# Patient Record
Sex: Female | Born: 1990 | Race: Black or African American | Hispanic: No | Marital: Single | Smoking: Former smoker
Health system: Southern US, Community
[De-identification: ages and names within clinical notes are randomized; demographics above are authoritative.]

## PROBLEM LIST (undated history)

## (undated) ENCOUNTER — Inpatient Hospital Stay (HOSPITAL_COMMUNITY): Payer: Self-pay

## (undated) DIAGNOSIS — N83209 Unspecified ovarian cyst, unspecified side: Secondary | ICD-10-CM

## (undated) HISTORY — PX: OVARIAN CYST REMOVAL: SHX89

## (undated) HISTORY — PX: INDUCED ABORTION: SHX677

---

## 2014-03-15 ENCOUNTER — Inpatient Hospital Stay (HOSPITAL_COMMUNITY)
Admission: AD | Admit: 2014-03-15 | Discharge: 2014-03-16 | Disposition: A | Discharge status: Home / Self Care | Payer: Medicaid - Out of State | Source: Ambulatory Visit | Attending: Obstetrics & Gynecology | Admitting: Obstetrics & Gynecology

## 2014-03-15 ENCOUNTER — Encounter (HOSPITAL_COMMUNITY): Payer: Self-pay | Admitting: *Deleted

## 2014-03-15 DIAGNOSIS — A499 Bacterial infection, unspecified: Secondary | ICD-10-CM | POA: Insufficient documentation

## 2014-03-15 DIAGNOSIS — O208 Other hemorrhage in early pregnancy: Secondary | ICD-10-CM | POA: Insufficient documentation

## 2014-03-15 DIAGNOSIS — Z87891 Personal history of nicotine dependence: Secondary | ICD-10-CM | POA: Insufficient documentation

## 2014-03-15 DIAGNOSIS — R109 Unspecified abdominal pain: Secondary | ICD-10-CM | POA: Insufficient documentation

## 2014-03-15 DIAGNOSIS — O219 Vomiting of pregnancy, unspecified: Secondary | ICD-10-CM

## 2014-03-15 DIAGNOSIS — N760 Acute vaginitis: Secondary | ICD-10-CM

## 2014-03-15 DIAGNOSIS — O210 Mild hyperemesis gravidarum: Secondary | ICD-10-CM | POA: Insufficient documentation

## 2014-03-15 DIAGNOSIS — O2390 Unspecified genitourinary tract infection in pregnancy, unspecified trimester: Secondary | ICD-10-CM | POA: Insufficient documentation

## 2014-03-15 DIAGNOSIS — B9689 Other specified bacterial agents as the cause of diseases classified elsewhere: Secondary | ICD-10-CM | POA: Insufficient documentation

## 2014-03-15 LAB — URINALYSIS, ROUTINE W REFLEX MICROSCOPIC
BILIRUBIN URINE: NEGATIVE
GLUCOSE, UA: NEGATIVE mg/dL
HGB URINE DIPSTICK: NEGATIVE
Ketones, ur: >80 mg/dL — AB
Leukocytes, UA: NEGATIVE
Nitrite: NEGATIVE
Protein, ur: NEGATIVE mg/dL
Specific Gravity, Urine: 1.015 (ref 1.005–1.030)
UROBILINOGEN UA: 0.2 mg/dL (ref 0.0–1.0)
pH: 6.5 (ref 5.0–8.0)

## 2014-03-15 LAB — POCT PREGNANCY, URINE: PREG TEST UR: POSITIVE — AB

## 2014-03-15 MED ORDER — PROMETHAZINE HCL 25 MG/ML IJ SOLN
25.0000 mg | Freq: Once | INTRAVENOUS | Status: AC
  Administered 2014-03-15: 25 mg via INTRAVENOUS
  Filled 2014-03-15: qty 1

## 2014-03-15 MED ORDER — FAMOTIDINE IN NACL 20-0.9 MG/50ML-% IV SOLN
20.0000 mg | Freq: Once | INTRAVENOUS | Status: AC
  Administered 2014-03-16: 20 mg via INTRAVENOUS
  Filled 2014-03-15: qty 50

## 2014-03-15 NOTE — MAU Note (Addendum)
PT  SAYS SHE  HAS BEEN VOMITING   SINCE 0700-    AND EVERYDAY  X2 WEEKS.   SHE DID   HPT-  POSITIVE.  LAST SEX-  6-7.   WAS ON JENEL FE- 120-  STOPPED   ON 5-10.    GOT [facility]- IN [place].  MOVED  HERE 5-31    SAYS HER THROAT BURNS  AFTER  SHE  VOMITED  SOME ALMONDS

## 2014-03-15 NOTE — MAU Provider Note (Signed)
History     CSN: [id number]  Arrival date and time: 03/15/14 2231   First Mariah Raymond Initiated Contact with Patient 03/15/14 2338      No chief complaint on file.  HPI [name] is a 23 y.o. G3P0020 at 1532w6d who presents to MAU today with complaint of N/V. The patient states this has been going on x 2 weeks, but much worse today. She states no PO intake since yesterday. She denies fever or diarrhea or constipation. She has had some lower abdominal pain, but denies vaginal bleeding or discharge.   OB History   Grav Para Term Preterm Abortions TAB SAB Ect Mult Living   3    2 1 1          Past Medical History  Diagnosis Date  . Medical history non-contributory     Past Surgical History  Procedure Laterality Date  . Ovarian cyst removal      removal of right ovary    History reviewed. No pertinent family history.  History  Substance Use Topics  . Smoking status: Former Games developermoker  . Smokeless tobacco: Not on file  . Alcohol Use: Yes     Comment: "quit"- last use May 2015    Allergies: Not on File  No prescriptions prior to admission    Review of Systems  Constitutional: Negative for fever and malaise/fatigue.  Gastrointestinal: Positive for nausea, vomiting and abdominal pain. Negative for diarrhea and constipation.  Genitourinary: Negative for dysuria, urgency and frequency.       Neg -vaginal bleeding, discharge   Physical Exam   Blood pressure 101/41, pulse 77, temperature 98.2 F (36.8 C), temperature source Oral, resp. rate 18, height 5\' 3"  (1.6 m), weight 110 lb 6 oz (50.066 kg), last menstrual period 01/22/2014, unknown if currently breastfeeding.  Physical Exam  Constitutional: She is oriented to person, place, and time. She appears well-developed and well-nourished. No distress.  HENT:  Head: Normocephalic and atraumatic.  Cardiovascular: Normal rate, regular rhythm and normal heart sounds.   Respiratory: Effort normal and breath sounds normal. No  respiratory distress.  GI: Soft. Bowel sounds are normal. She exhibits no distension and no mass. There is tenderness (moderate tenderness to palpation of the lower abdomen bilaterally). There is no rebound and no guarding.  Genitourinary: Uterus is enlarged (slightly). Uterus is not tender. Cervix exhibits no motion tenderness, no discharge and no friability. Right adnexum displays tenderness. Right adnexum displays no mass. Left adnexum displays no mass and no tenderness. No bleeding around the vagina. No vaginal discharge found.  Neurological: She is alert and oriented to person, place, and time.  Skin: Skin is warm and dry. No erythema.  Psychiatric: She has a normal mood and affect.   Results for orders placed during the hospital encounter of 03/15/14 (from the past 24 hour(s))  URINALYSIS, ROUTINE W REFLEX MICROSCOPIC     Status: Abnormal   Collection Time    03/15/14 10:58 PM      Result Value Ref Range   Color, Urine YELLOW  YELLOW   APPearance CLEAR  CLEAR   Specific Gravity, Urine 1.015  1.005 - 1.030   pH 6.5  5.0 - 8.0   Glucose, UA NEGATIVE  NEGATIVE mg/dL   Hgb urine dipstick NEGATIVE  NEGATIVE   Bilirubin Urine NEGATIVE  NEGATIVE   Ketones, ur >80 (*) NEGATIVE mg/dL   Protein, ur NEGATIVE  NEGATIVE mg/dL   Urobilinogen, UA 0.2  0.0 - 1.0 mg/dL   Nitrite NEGATIVE  NEGATIVE   Leukocytes, UA NEGATIVE  NEGATIVE  POCT PREGNANCY, URINE     Status: Abnormal   Collection Time    03/15/14 11:06 PM      Result Value Ref Range   Preg Test, Ur POSITIVE (*) NEGATIVE  CBC WITH DIFFERENTIAL     Status: Abnormal   Collection Time    03/15/14 11:55 PM      Result Value Ref Range   WBC 6.6  4.0 - 10.5 K/uL   RBC 4.31  3.87 - 5.11 MIL/uL   Hemoglobin 12.2  12.0 - 15.0 g/dL   HCT 16.1 (*) 09.6 - 04.5 %   MCV 83.1  78.0 - 100.0 fL   MCH 28.3  26.0 - 34.0 pg   MCHC 34.1  30.0 - 36.0 g/dL   RDW 40.9  81.1 - 91.4 %   Platelets 264  150 - 400 K/uL   Neutrophils Relative % 72  43 - 77  %   Neutro Abs 4.8  1.7 - 7.7 K/uL   Lymphocytes Relative 23  12 - 46 %   Lymphs Abs 1.5  0.7 - 4.0 K/uL   Monocytes Relative 4  3 - 12 %   Monocytes Absolute 0.3  0.1 - 1.0 K/uL   Eosinophils Relative 1  0 - 5 %   Eosinophils Absolute 0.0  0.0 - 0.7 K/uL   Basophils Relative 0  0 - 1 %   Basophils Absolute 0.0  0.0 - 0.1 K/uL  ABO/RH     Status: None   Collection Time    03/15/14 11:55 PM      Result Value Ref Range   ABO/RH(D) B POS    HCG, QUANTITATIVE, PREGNANCY     Status: Abnormal   Collection Time    03/15/14 11:55 PM      Result Value Ref Range   hCG, Beta Chain, Quant, [place] [address] (*) <5 mIU/mL  WET PREP, GENITAL     Status: Abnormal   Collection Time    03/15/14 11:58 PM      Result Value Ref Range   Yeast Wet Prep HPF POC NONE SEEN  NONE SEEN   Trich, Wet Prep NONE SEEN  NONE SEEN   Clue Cells Wet Prep HPF POC MODERATE (*) NONE SEEN   WBC, Wet Prep HPF POC FEW (*) NONE SEEN   US Ob Comp Less 14 Wks  03/16/2014   CLINICAL DATA:  Low abdominal pain. Positive urine pregnancy test. Estimated gestational age by LMP is 6 weeks 6 days. Quantitative beta HCG is 47,894.  EXAM: OBSTETRIC <14 WK ULTRASOUND  TECHNIQUE: Transabdominal ultrasound was performed for evaluation of the gestation as well as the maternal uterus and adnexal regions.  COMPARISON:  None.  FINDINGS: Intrauterine gestational sac: A single intrauterine gestational sac is visualized.  Yolk sac:  Yolk sac is visualized.  Embryo:  Fetal pole is visualized.  Cardiac Activity: Fetal cardiac activity is observed.  Heart Rate: 134 bpm  CRL:   6.5  mm   6 w 4 d                  [place] EDC: 11/05/2014  Maternal uterus/adnexae: There is a small subchorionic hemorrhage present. No myometrial mass lesions. Right ovary is identified and appears normal. Left ovary is not visualized. Mild free fluid in the pelvis.  IMPRESSION: Single intrauterine pregnancy identified. Estimated gestational age by crown-rump length is 6 weeks 4 days. This is  consistent with reported  maternal dates. Small subchorionic hemorrhage is identified.   Electronically Signed   By: Burman NievesWilliam  Stevens M.D.   On: 03/16/2014 01:25   [place] Ob Transvaginal  03/16/2014   CLINICAL DATA:  Low abdominal pain. Positive urine pregnancy test. Estimated gestational age by LMP is 6 weeks 6 days. Quantitative beta HCG is 47,894.  EXAM: OBSTETRIC <14 WK ULTRASOUND  TECHNIQUE: Transabdominal ultrasound was performed for evaluation of the gestation as well as the maternal uterus and adnexal regions.  COMPARISON:  None.  FINDINGS: Intrauterine gestational sac: A single intrauterine gestational sac is visualized.  Yolk sac:  Yolk sac is visualized.  Embryo:  Fetal pole is visualized.  Cardiac Activity: Fetal cardiac activity is observed.  Heart Rate: 134 bpm  CRL:   6.5  mm   6 w 4 d                  US EDC: 11/05/2014  Maternal uterus/adnexae: There is a small subchorionic hemorrhage present. No myometrial mass lesions. Right ovary is identified and appears normal. Left ovary is not visualized. Mild free fluid in the pelvis.  IMPRESSION: Single intrauterine pregnancy identified. Estimated gestational age by crown-rump length is 6 weeks 4 days. This is consistent with reported maternal dates. Small subchorionic hemorrhage is identified.   Electronically Signed   By: Burman NievesWilliam  Stevens M.D.   On: 03/16/2014 01:25    MAU Course  Procedures None  MDM +UPT UA, wet prep, GC/Chlamydia, CBC, ABO/Rh, quant hCG and US today 1 liter IV D5LR with 25 mg Phenergan infusion and 20 mg Pepcid IVPB given Patient is still reporting nausea 10 mg Reglan IV given. Patient reports resolution of symptoms. No additional episodes of emesis in MAU.  Assessment and Plan  A: SIUP at 7453w4d with normal cardiac activity Small subchorionic hemorrhage Bacterial vaginosis Nausea and vomiting in pregnancy prior to [redacted] weeks gestation  P: Discharge home Rx for Reglan, Phenergan suppositories and Flagyl given to  patient Bleeding precautions discussed Patient advised to increased PO hydration and advance diet as tolerated Patient advised to start prenatal care ASAP Patient may return to MAU as needed or if her condition were to change or worsen   Freddi StarrJulie N Ethier, PA-C  03/16/2014, 2:47 AM

## 2014-03-16 ENCOUNTER — Inpatient Hospital Stay (HOSPITAL_COMMUNITY): Payer: Medicaid - Out of State

## 2014-03-16 ENCOUNTER — Encounter (HOSPITAL_COMMUNITY): Payer: Self-pay | Admitting: Medical

## 2014-03-16 DIAGNOSIS — O210 Mild hyperemesis gravidarum: Secondary | ICD-10-CM

## 2014-03-16 LAB — CBC WITH DIFFERENTIAL/PLATELET
Basophils Absolute: 0 10*3/uL (ref 0.0–0.1)
Basophils Relative: 0 % (ref 0–1)
Eosinophils Absolute: 0 10*3/uL (ref 0.0–0.7)
Eosinophils Relative: 1 % (ref 0–5)
HEMATOCRIT: 35.8 % — AB (ref 36.0–46.0)
HEMOGLOBIN: 12.2 g/dL (ref 12.0–15.0)
LYMPHS PCT: 23 % (ref 12–46)
Lymphs Abs: 1.5 10*3/uL (ref 0.7–4.0)
MCH: 28.3 pg (ref 26.0–34.0)
MCHC: 34.1 g/dL (ref 30.0–36.0)
MCV: 83.1 fL (ref 78.0–100.0)
MONO ABS: 0.3 10*3/uL (ref 0.1–1.0)
MONOS PCT: 4 % (ref 3–12)
Neutro Abs: 4.8 10*3/uL (ref 1.7–7.7)
Neutrophils Relative %: 72 % (ref 43–77)
Platelets: 264 10*3/uL (ref 150–400)
RBC: 4.31 MIL/uL (ref 3.87–5.11)
RDW: 12.5 % (ref 11.5–15.5)
WBC: 6.6 10*3/uL (ref 4.0–10.5)

## 2014-03-16 LAB — GC/CHLAMYDIA PROBE AMP
CT PROBE, AMP APTIMA: NEGATIVE
GC Probe RNA: NEGATIVE

## 2014-03-16 LAB — WET PREP, GENITAL
TRICH WET PREP: NONE SEEN
Yeast Wet Prep HPF POC: NONE SEEN

## 2014-03-16 LAB — HCG, QUANTITATIVE, PREGNANCY: hCG, Beta Chain, Quant, S: 47894 m[IU]/mL — ABNORMAL HIGH (ref <5)

## 2014-03-16 LAB — ABO/RH: ABO/RH(D): B POS

## 2014-03-16 LAB — HIV ANTIBODY (ROUTINE TESTING W REFLEX): HIV 1&2 Ab, 4th Generation: NONREACTIVE

## 2014-03-16 MED ORDER — METOCLOPRAMIDE HCL 10 MG PO TABS: 10.0000 mg | ORAL_TABLET | Freq: Four times a day (QID) | ORAL | Status: DC

## 2014-03-16 MED ORDER — METOCLOPRAMIDE HCL 5 MG/ML IJ SOLN
10.0000 mg | Freq: Once | INTRAMUSCULAR | Status: AC
  Administered 2014-03-16: 10 mg via INTRAVENOUS
  Filled 2014-03-16: qty 2

## 2014-03-16 MED ORDER — METRONIDAZOLE 500 MG PO TABS: 500.0000 mg | ORAL_TABLET | Freq: Two times a day (BID) | ORAL | Status: DC

## 2014-03-16 MED ORDER — LACTATED RINGERS IV BOLUS (SEPSIS)
1000.0000 mL | Freq: Once | INTRAVENOUS | Status: AC
  Administered 2014-03-16: 1000 mL via INTRAVENOUS

## 2014-03-16 MED ORDER — PROMETHAZINE HCL 25 MG RE SUPP: 25.0000 mg | Freq: Four times a day (QID) | RECTAL | Status: DC | PRN

## 2014-03-16 NOTE — Discharge Instructions (Signed)
Bacterial Vaginosis Bacterial vaginosis is a vaginal infection that occurs when the normal balance of bacteria in the vagina is disrupted. It results from an overgrowth of certain bacteria. This is the most common vaginal infection in women of childbearing age. Treatment is important to prevent complications, especially in pregnant women, as it can cause a premature delivery. CAUSES  Bacterial vaginosis is caused by an increase in harmful bacteria that are normally present in smaller amounts in the vagina. Several different kinds of bacteria can cause bacterial vaginosis. However, the reason that the condition develops is not fully understood. RISK FACTORS Certain activities or behaviors can put you at an increased risk of developing bacterial vaginosis, including:  Having a new sex partner or multiple sex partners.  Douching.  Using an intrauterine device (IUD) for contraception. Women do not get bacterial vaginosis from toilet seats, bedding, swimming pools, or contact with objects around them. SIGNS AND SYMPTOMS  Some women with bacterial vaginosis have no signs or symptoms. Common symptoms include:  Grey vaginal discharge.  A fishlike odor with discharge, especially after sexual intercourse.  Itching or burning of the vagina and vulva.  Burning or pain with urination. DIAGNOSIS  Your health care provider will take a medical history and examine the vagina for signs of bacterial vaginosis. A sample of vaginal fluid may be taken. Your health care provider will look at this sample under a microscope to check for bacteria and abnormal cells. A vaginal pH test may also be done.  TREATMENT  Bacterial vaginosis may be treated with antibiotic medicines. These may be given in the form of a pill or a vaginal cream. A second round of antibiotics may be prescribed if the condition comes back after treatment.  HOME CARE INSTRUCTIONS   Only take over-the-counter or prescription medicines as  directed by your health care provider.  If antibiotic medicine was prescribed, take it as directed. Make sure you finish it even if you start to feel better.  Do not have sex until treatment is completed.  Tell all sexual partners that you have a vaginal infection. They should see their health care provider and be treated if they have problems, such as a mild rash or itching.  Practice safe sex by using condoms and only having one sex partner. SEEK MEDICAL CARE IF:   Your symptoms are not improving after 3 days of treatment.  You have increased discharge or pain.  You have a fever. MAKE SURE YOU:   Understand these instructions.  Will watch your condition.  Will get help right away if you are not doing well or get worse. FOR MORE INFORMATION  Centers for Disease Control and Prevention, Division of STD Prevention: SolutionApps.co.zawww.cdc.gov/std American Sexual Health Association (ASHA): www.ashastd.org  Document Released: 09/10/2005 Document Revised: 07/01/2013 Document Reviewed: 04/22/2013 [facility]ExitCare Patient Information 2015 [place], [place]. This information is not intended to replace advice given to you by your health care provider. Make sure you discuss any questions you have with your health care provider. Morning Sickness Morning sickness is when you feel sick to your stomach (nauseous) during pregnancy. You may feel sick to your stomach and throw up (vomit). You may feel sick in the morning, but you can feel this way any time of day. Some women feel very sick to their stomach and cannot stop throwing up (hyperemesis gravidarum). HOME CARE  Only take medicines as told by your doctor.  Take multivitamins as told by your doctor. Taking multivitamins before getting pregnant can stop or  lessen the harshness of morning sickness. °· Eat dry toast or unsalted crackers before getting out of bed. °· Eat 5 to 6 small meals a day. °· Eat dry and bland foods like rice and baked potatoes. °· Do not drink  liquids with meals. Drink between meals. °· Do not eat greasy, fatty, or spicy foods. °· Have someone cook for you if the smell of food causes you to feel sick or throw up. °· If you feel sick to your stomach after taking prenatal vitamins, take them at night or with a snack. °· Eat protein when you need a snack (nuts, yogurt, cheese). °· Eat unsweetened gelatins for dessert. °· Wear a bracelet used for sea sickness (acupressure wristband). °· Go to a doctor that puts thin needles into certain body points (acupuncture) to improve how you feel. °· Do not smoke. °· Use a humidifier to keep the air in your house free of odors. °· Get lots of fresh air. °GET HELP IF: °· You need medicine to feel better. °· You feel dizzy or lightheaded. °· You are losing weight. °GET HELP RIGHT AWAY IF:  °· You feel very sick to your stomach and cannot stop throwing up. °· You pass out (faint). °MAKE SURE YOU: °· Understand these instructions. °· Will watch your condition. °· Will get help right away if you are not doing well or get worse. °Document Released: 10/18/2004 Document Revised: 09/15/2013 Document Reviewed: 02/25/2013 °ExitCare® Patient Information ©2015 ExitCare, LLC. This information is not intended to replace advice given to you by your health care provider. Make sure you discuss any questions you have with your health care provider. ° °

## 2014-03-23 NOTE — MAU Provider Note (Signed)
Attestation of Attending Supervision of Advanced Practitioner (CNM/NP): Evaluation and management procedures were performed by the Advanced Practitioner under my supervision and collaboration. I have reviewed the Advanced Practitioner's note and chart, and I agree with the management and plan.  LEGGETT,KELLY H. 6:44 AM

## 2014-04-13 ENCOUNTER — Encounter (HOSPITAL_COMMUNITY): Payer: Self-pay | Admitting: *Deleted

## 2014-04-13 ENCOUNTER — Inpatient Hospital Stay (HOSPITAL_COMMUNITY)
Admission: AD | Admit: 2014-04-13 | Discharge: 2014-04-13 | Disposition: A | Discharge status: Home / Self Care | Payer: Medicaid - Out of State | Source: Ambulatory Visit | Attending: Obstetrics & Gynecology | Admitting: Obstetrics & Gynecology

## 2014-04-13 DIAGNOSIS — O219 Vomiting of pregnancy, unspecified: Secondary | ICD-10-CM

## 2014-04-13 DIAGNOSIS — Z87891 Personal history of nicotine dependence: Secondary | ICD-10-CM | POA: Insufficient documentation

## 2014-04-13 DIAGNOSIS — O210 Mild hyperemesis gravidarum: Secondary | ICD-10-CM | POA: Insufficient documentation

## 2014-04-13 HISTORY — DX: Unspecified ovarian cyst, unspecified side: N83.209

## 2014-04-13 LAB — URINALYSIS, ROUTINE W REFLEX MICROSCOPIC
Bilirubin Urine: NEGATIVE
GLUCOSE, UA: NEGATIVE mg/dL
Hgb urine dipstick: NEGATIVE
KETONES UR: NEGATIVE mg/dL
Leukocytes, UA: NEGATIVE
Nitrite: NEGATIVE
PROTEIN: NEGATIVE mg/dL
Specific Gravity, Urine: 1.02 (ref 1.005–1.030)
UROBILINOGEN UA: 0.2 mg/dL (ref 0.0–1.0)
pH: 6 (ref 5.0–8.0)

## 2014-04-13 MED ORDER — FAMOTIDINE 20 MG PO TABS: 20.0000 mg | ORAL_TABLET | Freq: Two times a day (BID) | ORAL | Status: DC

## 2014-04-13 MED ORDER — FAMOTIDINE IN NACL 20-0.9 MG/50ML-% IV SOLN
20.0000 mg | Freq: Once | INTRAVENOUS | Status: AC
  Administered 2014-04-13: 20 mg via INTRAVENOUS
  Filled 2014-04-13: qty 50

## 2014-04-13 MED ORDER — LACTATED RINGERS IV BOLUS (SEPSIS)
1000.0000 mL | Freq: Once | INTRAVENOUS | Status: AC
  Administered 2014-04-13: 1000 mL via INTRAVENOUS

## 2014-04-13 MED ORDER — METOCLOPRAMIDE HCL 5 MG/ML IJ SOLN
10.0000 mg | Freq: Once | INTRAMUSCULAR | Status: AC
  Administered 2014-04-13: 10 mg via INTRAVENOUS
  Filled 2014-04-13: qty 2

## 2014-04-13 NOTE — Discharge Instructions (Signed)
Morning Sickness °Morning sickness is when you feel sick to your stomach (nauseous) during pregnancy. You may feel sick to your stomach and throw up (vomit). You may feel sick in the morning, but you can feel this way any time of day. Some women feel very sick to their stomach and cannot stop throwing up (hyperemesis gravidarum). °HOME CARE °· Only take medicines as told by your doctor. °· Take multivitamins as told by your doctor. Taking multivitamins before getting pregnant can stop or lessen the harshness of morning sickness. °· Eat dry toast or unsalted crackers before getting out of bed. °· Eat 5 to 6 small meals a day. °· Eat dry and bland foods like rice and baked potatoes. °· Do not drink liquids with meals. Drink between meals. °· Do not eat greasy, fatty, or spicy foods. °· Have someone cook for you if the smell of food causes you to feel sick or throw up. °· If you feel sick to your stomach after taking prenatal vitamins, take them at night or with a snack. °· Eat protein when you need a snack (nuts, yogurt, cheese). °· Eat unsweetened gelatins for dessert. °· Wear a bracelet used for sea sickness (acupressure wristband). °· Go to a doctor that puts thin needles into certain body points (acupuncture) to improve how you feel. °· Do not smoke. °· Use a humidifier to keep the air in your house free of odors. °· Get lots of fresh air. °GET HELP IF: °· You need medicine to feel better. °· You feel dizzy or lightheaded. °· You are losing weight. °GET HELP RIGHT AWAY IF:  °· You feel very sick to your stomach and cannot stop throwing up. °· You pass out (faint). °MAKE SURE YOU: °· Understand these instructions. °· Will watch your condition. °· Will get help right away if you are not doing well or get worse. °Document Released: 10/18/2004 Document Revised: 09/15/2013 Document Reviewed: 02/25/2013 °ExitCare® Patient Information ©2015 ExitCare, LLC. This information is not intended to replace advice given to you by  your health care provider. Make sure you discuss any questions you have with your health care provider. ° °Eating Plan for Hyperemesis Gravidarum °Severe cases of hyperemesis gravidarum can lead to dehydration and malnutrition. The hyperemesis eating plan is one way to lessen the symptoms of nausea and vomiting. It is often used with prescribed medicines to control your symptoms.  °WHAT CAN I DO TO RELIEVE MY SYMPTOMS? °Listen to your body. Everyone is different and has different preferences. Find what works best for you. Some of the following things may help: °· Eat and drink slowly. °· Eat 5-6 small meals daily instead of 3 large meals.   °· Eat crackers before you get out of bed in the morning.   °· Starchy foods are usually well tolerated (such as cereal, toast, bread, potatoes, pasta, rice, and pretzels).   °· Ginger may help with nausea. Add ¼ tsp ground ginger to hot tea or choose ginger tea.   °· Try drinking 100% fruit juice or an electrolyte drink. °· Continue to take your prenatal vitamins as directed by your health care provider. If you are having trouble taking your prenatal vitamins, talk with your health care provider about different options. °· Include at least 1 serving of protein with your meals and snacks (such as meats or poultry, beans, nuts, eggs, or yogurt). Try eating a protein-rich snack before bed (such as cheese and crackers or a half turkey or peanut butter sandwich). °WHAT THINGS SHOULD I   AVOID TO REDUCE MY SYMPTOMS? °The following things may help reduce your symptoms: °· Avoid foods with strong smells. Try eating meals in well-ventilated areas that are free of odors. °· Avoid drinking water or other beverages with meals. Try not to drink anything less than 30 minutes before and after meals. °· Avoid drinking more than 1 cup of fluid at a time. °· Avoid fried or high-fat foods, such as butter and cream sauces. °· Avoid spicy foods. °· Avoid skipping meals the best you can. Nausea can be  more intense on an empty stomach. If you cannot tolerate food at that time, do not force it. Try sucking on ice chips or other frozen items and make up the calories later. °· Avoid lying down within 2 hours after eating. °Document Released: 07/08/2007 Document Revised: 09/15/2013 Document Reviewed: 07/15/2013 °ExitCare® Patient Information ©2015 ExitCare, LLC. This information is not intended to replace advice given to you by your health care provider. Make sure you discuss any questions you have with your health care provider. ° °

## 2014-04-13 NOTE — MAU Provider Note (Signed)
History     CSN: [id number]  Arrival date and time: 04/13/14 1355   First Provider Initiated Contact with Patient 04/13/14 1416      Chief Complaint  Patient presents with  . Dizziness  . Morning Sickness   HPI [name] is a 23 y.o. G3P0020 at 9856w5d who presents to MAU today with complaint of nausea and vomiting. The patient was seen previously in this pregnancy for this and given reglan and phenergan suppositories. She states that she can't keep the Reglan down and that the Phenergan works, but makes her really sleepy so she can't take it all the time. She was also given Flagyl for BV and hasn't been able to take that. She states that she has good days and bad days and the last two days have been bad. She was able to tolerate PO this morning. She states mild lower abdominal pain occasionally. She had IUP on previous US. She denies vaginal bleeding, abnormal discharge or fever.   OB History   Grav Para Term Preterm Abortions TAB SAB Ect Mult Living   3    2 1 1          Past Medical History  Diagnosis Date  . Ovarian cyst     Past Surgical History  Procedure Laterality Date  . Ovarian cyst removal      removal of right ovary    History reviewed. No pertinent family history.  History  Substance Use Topics  . Smoking status: Former Games developermoker  . Smokeless tobacco: Not on file  . Alcohol Use: Yes     Comment: "quit"- last use May 2015    Allergies: No Known Allergies  No prescriptions prior to admission    Review of Systems  Constitutional: Positive for malaise/fatigue. Negative for fever.  Gastrointestinal: Positive for nausea, vomiting and abdominal pain. Negative for diarrhea and constipation.  Genitourinary: Negative for dysuria, urgency and frequency.       Neg - vaginal bleeding + vaginal discharge  Neurological: Negative for weakness.   Physical Exam   Blood pressure 155/58, pulse 81, temperature 98.7 F (37.1 C), temperature source Oral, resp.  rate 18, height 5\' 2"  (1.575 m), weight 119 lb (53.978 kg), last menstrual period 01/22/2014, unknown if currently breastfeeding.  Physical Exam  Constitutional: She is oriented to person, place, and time. She appears well-developed and well-nourished. No distress.  HENT:  Head: Normocephalic and atraumatic.  Cardiovascular: Normal rate.   Respiratory: Effort normal.  GI: Soft. She exhibits no distension and no mass. There is tenderness (mild tenderness to palpation of the lower abdomen). There is no rebound and no guarding.  Neurological: She is alert and oriented to person, place, and time.  Skin: Skin is warm and dry. No erythema.  Psychiatric: She has a normal mood and affect.   Results for orders placed during the hospital encounter of 04/13/14 (from the past 24 hour(s))  URINALYSIS, ROUTINE W REFLEX MICROSCOPIC     Status: Abnormal   Collection Time    04/13/14  1:59 PM      Result Value Ref Range   Color, Urine YELLOW  YELLOW   APPearance HAZY (*) CLEAR   Specific Gravity, Urine 1.020  1.005 - 1.030   pH 6.0  5.0 - 8.0   Glucose, UA NEGATIVE  NEGATIVE mg/dL   Hgb urine dipstick NEGATIVE  NEGATIVE   Bilirubin Urine NEGATIVE  NEGATIVE   Ketones, ur NEGATIVE  NEGATIVE mg/dL   Protein, ur NEGATIVE  NEGATIVE mg/dL   Urobilinogen, UA 0.2  0.0 - 1.0 mg/dL   Nitrite NEGATIVE  NEGATIVE   Leukocytes, UA NEGATIVE  NEGATIVE    MAU Course  Procedures None  MDM FHR - 162 bpm with doppler UA today 1 liter LR bolus with 10 mg Reglan and 20 mg Pepcid Patient reports improvement in symptoms and is able to tolerate PO in MAU Assessment and Plan  A: SIUP at 108w5d Nausea and vomiting in pregnancy prior to [redacted] weeks gestation  P: Discharge home Rx for Pepcid given to patient Patient advised to continue Reglan and Phenergan as previously prescribed AVS contains information for hyperemesis diet Patient may return to MAU as needed or if her condition were to change or worsen  Freddi Starr, PA-C  04/13/2014, 4:12 PM

## 2014-04-13 NOTE — MAU Provider Note (Signed)

## 2014-04-13 NOTE — MAU Note (Signed)
Pt states that she is no longer nauseous and feels better at this time. POC discussed, discharge instructions given. Pt verbalizes understanding.

## 2014-04-13 NOTE — MAU Note (Signed)
Cramping, dizzy, lightheaded, N/V. States Reglan not working anymore. Suppositories make her too sleepy.

## 2014-06-25 LAB — OB RESULTS CONSOLE RUBELLA ANTIBODY, IGM: RUBELLA: IMMUNE

## 2014-06-25 LAB — OB RESULTS CONSOLE HEPATITIS B SURFACE ANTIGEN: Hepatitis B Surface Ag: NEGATIVE

## 2014-06-25 LAB — OB RESULTS CONSOLE ANTIBODY SCREEN: Antibody Screen: NEGATIVE

## 2014-06-25 LAB — OB RESULTS CONSOLE RPR: RPR: NONREACTIVE

## 2014-07-27 ENCOUNTER — Encounter (HOSPITAL_COMMUNITY): Payer: Self-pay | Admitting: *Deleted

## 2014-10-06 LAB — OB RESULTS CONSOLE GC/CHLAMYDIA
Chlamydia: NEGATIVE
Gonorrhea: NEGATIVE

## 2014-10-06 LAB — OB RESULTS CONSOLE GBS: GBS: POSITIVE

## 2014-10-28 ENCOUNTER — Inpatient Hospital Stay (HOSPITAL_COMMUNITY)
Admission: AD | Admit: 2014-10-28 | Discharge: 2014-10-28 | Disposition: A | Discharge status: Home / Self Care | Payer: Medicaid Other | Source: Ambulatory Visit | Attending: Obstetrics and Gynecology | Admitting: Obstetrics and Gynecology

## 2014-10-28 ENCOUNTER — Encounter (HOSPITAL_COMMUNITY): Payer: Self-pay | Admitting: *Deleted

## 2014-10-28 DIAGNOSIS — Z3A39 39 weeks gestation of pregnancy: Secondary | ICD-10-CM | POA: Diagnosis not present

## 2014-10-28 DIAGNOSIS — O9989 Other specified diseases and conditions complicating pregnancy, childbirth and the puerperium: Secondary | ICD-10-CM | POA: Insufficient documentation

## 2014-10-28 DIAGNOSIS — R109 Unspecified abdominal pain: Secondary | ICD-10-CM | POA: Insufficient documentation

## 2014-10-28 NOTE — Discharge Instructions (Signed)

## 2014-10-28 NOTE — MAU Note (Signed)
C/o cramping that started around 0915 this AM;

## 2014-11-09 ENCOUNTER — Other Ambulatory Visit (HOSPITAL_COMMUNITY): Payer: Self-pay | Admitting: Obstetrics and Gynecology

## 2014-11-10 ENCOUNTER — Encounter (HOSPITAL_COMMUNITY): Payer: Self-pay | Admitting: *Deleted

## 2014-11-10 ENCOUNTER — Inpatient Hospital Stay (HOSPITAL_COMMUNITY): Payer: Medicaid Other | Admitting: Anesthesiology

## 2014-11-10 ENCOUNTER — Inpatient Hospital Stay (HOSPITAL_COMMUNITY)
Admission: AD | Admit: 2014-11-10 | Discharge: 2014-11-13 | DRG: 766 | Disposition: A | Discharge status: Home / Self Care | Payer: Medicaid Other | Source: Ambulatory Visit | Attending: Obstetrics and Gynecology | Admitting: Obstetrics and Gynecology

## 2014-11-10 DIAGNOSIS — L910 Hypertrophic scar: Secondary | ICD-10-CM | POA: Diagnosis present

## 2014-11-10 DIAGNOSIS — L905 Scar conditions and fibrosis of skin: Secondary | ICD-10-CM | POA: Diagnosis present

## 2014-11-10 DIAGNOSIS — Z87891 Personal history of nicotine dependence: Secondary | ICD-10-CM | POA: Diagnosis not present

## 2014-11-10 DIAGNOSIS — Z98891 History of uterine scar from previous surgery: Secondary | ICD-10-CM

## 2014-11-10 DIAGNOSIS — O4292 Full-term premature rupture of membranes, unspecified as to length of time between rupture and onset of labor: Secondary | ICD-10-CM | POA: Diagnosis present

## 2014-11-10 DIAGNOSIS — Z3A40 40 weeks gestation of pregnancy: Secondary | ICD-10-CM | POA: Diagnosis present

## 2014-11-10 DIAGNOSIS — O09293 Supervision of pregnancy with other poor reproductive or obstetric history, third trimester: Secondary | ICD-10-CM

## 2014-11-10 DIAGNOSIS — O471 False labor at or after 37 completed weeks of gestation: Secondary | ICD-10-CM | POA: Diagnosis present

## 2014-11-10 DIAGNOSIS — O99824 Streptococcus B carrier state complicating childbirth: Secondary | ICD-10-CM | POA: Diagnosis present

## 2014-11-10 DIAGNOSIS — Z3490 Encounter for supervision of normal pregnancy, unspecified, unspecified trimester: Secondary | ICD-10-CM

## 2014-11-10 LAB — CBC
HCT: 32.4 % — ABNORMAL LOW (ref 36.0–46.0)
Hemoglobin: 10.6 g/dL — ABNORMAL LOW (ref 12.0–15.0)
MCH: 27.1 pg (ref 26.0–34.0)
MCHC: 32.7 g/dL (ref 30.0–36.0)
MCV: 82.9 fL (ref 78.0–100.0)
PLATELETS: 229 10*3/uL (ref 150–400)
RBC: 3.91 MIL/uL (ref 3.87–5.11)
RDW: 13.9 % (ref 11.5–15.5)
WBC: 7.2 10*3/uL (ref 4.0–10.5)

## 2014-11-10 LAB — TYPE AND SCREEN
ABO/RH(D): B POS
ANTIBODY SCREEN: NEGATIVE

## 2014-11-10 MED ORDER — OXYCODONE-ACETAMINOPHEN 5-325 MG PO TABS: 2.0000 | ORAL_TABLET | ORAL | Status: DC | PRN

## 2014-11-10 MED ORDER — PENICILLIN G POTASSIUM 5000000 UNITS IJ SOLR
5.0000 10*6.[IU] | Freq: Once | INTRAVENOUS | Status: AC
  Administered 2014-11-10: 5 10*6.[IU] via INTRAVENOUS
  Filled 2014-11-10: qty 5

## 2014-11-10 MED ORDER — FENTANYL 2.5 MCG/ML BUPIVACAINE 1/10 % EPIDURAL INFUSION (WH - ANES)
14.0000 mL/h | INTRAMUSCULAR | Status: DC | PRN
  Administered 2014-11-10 (×2): 14 mL/h via EPIDURAL
  Filled 2014-11-10 (×3): qty 125

## 2014-11-10 MED ORDER — LIDOCAINE HCL (PF) 1 % IJ SOLN
INTRAMUSCULAR | Status: DC | PRN
  Administered 2014-11-10 (×2): 4 mL

## 2014-11-10 MED ORDER — FENTANYL 2.5 MCG/ML BUPIVACAINE 1/10 % EPIDURAL INFUSION (WH - ANES)
INTRAMUSCULAR | Status: DC | PRN
  Administered 2014-11-10: 14 mL/h via EPIDURAL

## 2014-11-10 MED ORDER — DIPHENHYDRAMINE HCL 50 MG/ML IJ SOLN: 12.5000 mg | INTRAMUSCULAR | Status: DC | PRN

## 2014-11-10 MED ORDER — TERBUTALINE SULFATE 1 MG/ML IJ SOLN: 0.2500 mg | Freq: Once | INTRAMUSCULAR | Status: AC | PRN

## 2014-11-10 MED ORDER — OXYTOCIN 40 UNITS IN LACTATED RINGERS INFUSION - SIMPLE MED
1.0000 m[IU]/min | INTRAVENOUS | Status: DC
  Administered 2014-11-10: 2 m[IU]/min via INTRAVENOUS

## 2014-11-10 MED ORDER — BUTORPHANOL TARTRATE 1 MG/ML IJ SOLN: 1.0000 mg | INTRAMUSCULAR | Status: DC | PRN

## 2014-11-10 MED ORDER — LACTATED RINGERS IV SOLN
INTRAVENOUS | Status: DC
  Administered 2014-11-10 – 2014-11-11 (×4): via INTRAVENOUS

## 2014-11-10 MED ORDER — PHENYLEPHRINE 40 MCG/ML (10ML) SYRINGE FOR IV PUSH (FOR BLOOD PRESSURE SUPPORT)
80.0000 ug | PREFILLED_SYRINGE | INTRAVENOUS | Status: DC | PRN
  Filled 2014-11-10: qty 20

## 2014-11-10 MED ORDER — PENICILLIN G POTASSIUM 5000000 UNITS IJ SOLR
2.5000 10*6.[IU] | INTRAVENOUS | Status: DC
  Administered 2014-11-10 – 2014-11-11 (×5): 2.5 10*6.[IU] via INTRAVENOUS
  Filled 2014-11-10 (×8): qty 2.5

## 2014-11-10 MED ORDER — LACTATED RINGERS IV SOLN: 500.0000 mL | INTRAVENOUS | Status: DC | PRN

## 2014-11-10 MED ORDER — LACTATED RINGERS IV SOLN
INTRAVENOUS | Status: DC
  Administered 2014-11-10: 12:00:00 via INTRAVENOUS
  Administered 2014-11-10: 125 mL via INTRAVENOUS

## 2014-11-10 MED ORDER — CITRIC ACID-SODIUM CITRATE 334-500 MG/5ML PO SOLN
30.0000 mL | ORAL | Status: DC | PRN
  Administered 2014-11-11: 30 mL via ORAL
  Filled 2014-11-10: qty 15

## 2014-11-10 MED ORDER — EPHEDRINE 5 MG/ML INJ: 10.0000 mg | INTRAVENOUS | Status: DC | PRN

## 2014-11-10 MED ORDER — OXYTOCIN BOLUS FROM INFUSION: 500.0000 mL | INTRAVENOUS | Status: DC

## 2014-11-10 MED ORDER — OXYTOCIN 40 UNITS IN LACTATED RINGERS INFUSION - SIMPLE MED
62.5000 mL/h | INTRAVENOUS | Status: DC
  Filled 2014-11-10: qty 1000

## 2014-11-10 MED ORDER — LACTATED RINGERS IV SOLN
500.0000 mL | Freq: Once | INTRAVENOUS | Status: AC
  Administered 2014-11-10: 500 mL via INTRAVENOUS

## 2014-11-10 MED ORDER — LIDOCAINE HCL (PF) 1 % IJ SOLN: 30.0000 mL | INTRAMUSCULAR | Status: DC | PRN

## 2014-11-10 MED ORDER — OXYCODONE-ACETAMINOPHEN 5-325 MG PO TABS: 1.0000 | ORAL_TABLET | ORAL | Status: DC | PRN

## 2014-11-10 MED ORDER — ACETAMINOPHEN 325 MG PO TABS: 650.0000 mg | ORAL_TABLET | ORAL | Status: DC | PRN

## 2014-11-10 MED ORDER — ONDANSETRON HCL 4 MG/2ML IJ SOLN
4.0000 mg | Freq: Four times a day (QID) | INTRAMUSCULAR | Status: DC | PRN
  Administered 2014-11-10 (×2): 4 mg via INTRAVENOUS
  Filled 2014-11-10 (×2): qty 2

## 2014-11-10 MED ORDER — PHENYLEPHRINE 40 MCG/ML (10ML) SYRINGE FOR IV PUSH (FOR BLOOD PRESSURE SUPPORT): 80.0000 ug | PREFILLED_SYRINGE | INTRAVENOUS | Status: DC | PRN

## 2014-11-10 NOTE — Progress Notes (Signed)
Patient ID: Mariah PoissonPatrize Raymond, female   DOB: 07/29/1991, 24 y.o.   MRN: [id number] Pt requested an epidural when she was uncomfortable and 3-4 cm by the RN exam. She is comfortable now and is contracting q 3 minutes on 20 mu/ minute of pitocin. By my exam the cervix is still 2 cm 30 % effaced and the vertex is at - 3 station. AROM produced clear fluid.

## 2014-11-10 NOTE — Progress Notes (Signed)
Patient ID: [name], female   DOB: 09/01/1991, 24 y.o.   MRN: [id number] Pitocin at 32 mu/minute and contractions q 2-3 minutes The cervix is 5 cm 90% effaced and the vertex is at -1 station. The pelvis feels adequate.

## 2014-11-10 NOTE — Anesthesia Procedure Notes (Signed)
Epidural Patient location during procedure: OB Start time: 11/10/2014 11:30 AM  Staffing Anesthesiologist: Karie SchwalbeJUDD, Lashondra Vaquerano JENNETTE Performed by: anesthesiologist   Preanesthetic Checklist Completed: patient identified, site marked, surgical consent, pre-op evaluation, timeout performed, IV checked, risks and benefits discussed and monitors and equipment checked  Epidural Patient position: sitting Prep: site prepped and draped and DuraPrep Patient monitoring: continuous pulse ox and blood pressure Approach: midline Location: L3-L4 Injection technique: LOR saline  Needle:  Needle type: Tuohy  Needle gauge: 17 G Needle length: 9 cm and 9 Needle insertion depth: 5 cm cm Catheter type: closed end flexible Catheter size: 19 Gauge Catheter at skin depth: 10 cm Test dose: negative  Assessment Events: blood not aspirated, injection not painful, no injection resistance, negative IV test and no paresthesia  Additional Notes Patient identified. Risks/Benefits/Options discussed with patient including but not limited to bleeding, infection, nerve damage, paralysis, failed block, incomplete pain control, headache, blood pressure changes, nausea, vomiting, reactions to medication both or allergic, itching and postpartum back pain. Confirmed with bedside nurse the patient's most recent platelet count. Confirmed with patient that they are not currently taking any anticoagulation, have any bleeding history or any family history of bleeding disorders. Patient expressed understanding and wished to proceed. All questions were answered. Sterile technique was used throughout the entire procedure. Please see nursing notes for vital signs. Test dose was given through epidural catheter and negative prior to continuing to dose epidural or start infusion. Warning signs of high block given to the patient including shortness of breath, tingling/numbness in hands, complete motor block, or any concerning symptoms with  instructions to call for help. Patient was given instructions on fall risk and not to get out of bed. All questions and concerns addressed with instructions to call with any issues or inadequate analgesia.

## 2014-11-10 NOTE — H&P (Signed)
Mariah Raymond is a 24 y.o. female G3P0020 at 40+ weeks (EDD 11/05/14 by 6 week US) presenting for regular contractions in early labor.  Prenatal care significant for late start at 20 weeks and a poor obstetrical history with an 18 week PROM and delivery at home in 2010.  She was followed with cervical lengths until 25 weeks and had no issues.  She is GBS positive.  Prior to pregnancy in 2015 she had an 18cm ovarian cyst removed surgically with RSO and was benign.    Maternal Medical History:  Reason for admission: Contractions.   Contractions: Onset was 3-5 hours ago.   Frequency: regular.   Perceived severity is moderate.    Fetal activity: Perceived fetal activity is normal.    Prenatal Complications - Diabetes: none.    OB History    Gravida Para Term Preterm AB TAB SAB Ectopic Multiple Living   3    2 1 1        2008 EAB 2010 18 week loss PPROM and delivery at home  Past Medical History  Diagnosis Date  . Ovarian cyst    Past Surgical History  Procedure Laterality Date  . Ovarian cyst removal      removal of right ovary  . Induced abortion     Family History: family history is negative for Alcohol abuse, Arthritis, Asthma, Birth defects, Cancer, COPD, Depression, Diabetes, Drug abuse, Early death, Hearing loss, Heart disease, Hyperlipidemia, Hypertension, Kidney disease, Learning disabilities, Mental illness, Mental retardation, Miscarriages / Stillbirths, Stroke, Vision loss, and Varicose Veins. Social History:  reports that she has quit smoking. She does not have any smokeless tobacco history on file. She reports that she drinks alcohol. She reports that she uses illicit drugs (Marijuana).   Prenatal Transfer Tool  Maternal Diabetes: No Genetic Screening: Normal Maternal Ultrasounds/Referrals: Normal Fetal Ultrasounds or other Referrals:  None Maternal Substance Abuse:  Yes:  Type: Smoker, Marijuana  Quit smoking with pregnancy Significant Maternal Medications:   None Significant Maternal Lab Results:  Lab values include: Group B Strep positive Other Comments:  None  ROS  Dilation: 2 Effacement (%): 50 Station: -3 Exam by:: TEPPCO PartnersDenise Collison RN Blood pressure 135/80, pulse 91, temperature 98.3 F (36.8 C), temperature source Oral, resp. rate 18, last menstrual period 01/22/2014, unknown if currently breastfeeding. Maternal Exam:  Uterine Assessment: Contraction strength is moderate.  Contraction frequency is regular.   Abdomen: Patient reports no abdominal tenderness. Fetal presentation: vertex  Introitus: Normal vulva. Normal vagina.    Physical Exam  Constitutional: She appears well-developed and well-nourished.  Cardiovascular: Normal rate and regular rhythm.   Respiratory: Effort normal.  GI: Soft.  Genitourinary: Vagina normal and uterus normal.  Neurological: She is alert.    Prenatal labs: ABO, Rh: --/--/B POS (06/22 2355) Antibody: Negative (10/02 0000) Rubella: Immune (10/02 0000) RPR: Nonreactive (10/02 0000)  HBsAg: Negative (10/02 0000)  HIV: NONREACTIVE (06/22 2355)  GBS:   Positive One hour GTT 76 Quad screen negative Hgb AA  Assessment/Plan: Pt in early labor with plan for IOL later today anyway.  Will admit and augment with pitocin as needed.  PCN for +GBS.    Oliver PilaRICHARDSON,Nikkol Pai W 11/10/2014, 3:44 AM

## 2014-11-10 NOTE — MAU Note (Signed)
Admission orders received.

## 2014-11-10 NOTE — Progress Notes (Signed)
Patient ID: Mariah Raymond, female   DOB: 05/27/1991, 24 y.o.   MRN: [id number] This pt was scheduled for induction today but came in early with contractions. She was started on pitocin and penicillin for + GBS. She is on 8 mu/ minute of pitocin but contractions are not regular so no exam or amniotomy was done. RN on admission called the cervix 2 cm 50 % and vertex - 2

## 2014-11-10 NOTE — MAU Note (Signed)
Dr. Senaida Oresichardson would like pt. To walk and be reassessed in 2 hours. Pt states that she is being induced in the morning but is not on the L&D schedule.

## 2014-11-10 NOTE — H&P (Signed)
NAMLuz Raymond:  Surprenant, Tanicka              ACCOUNT NO.:  [id number]  MEDICAL RECORD NO.:  [id number]  LOCATION:                                 FACILITY:  PHYSICIAN:  Malachi Prohomas F. Ambrose MantleHenley, M.D. DATE OF BIRTH:  10-Oct-1990  DATE OF ADMISSION:  11/10/2014 DATE OF DISCHARGE:                             HISTORY & PHYSICAL   HISTORY OF PRESENT ILLNESS:  This is a 24 year old black female, para 0- 0-2-0, gravida 3, EDC November 05, 2014, admitted for induction of labor.  This patient began her prenatal course at our office at 20 weeks and 6 days.  [facility] March 15, 2014[facility]  Blood group and type B positive, negative antibody. RPR negative.  Urine culture negative.  Hepatitis B surface antigen negative.  HIV negative.  GC and Chlamydia negative.  Varicella immune. Rubella immune.  Hemoglobin electrophoresis AA.  Quad screen was negative.  Repeat GC and chlamydia were negative.  Repeat HIV and RPR negative.  Group B strep was positive.  The patient was seen at her first visit at 20 weeks and 6 days.  Cervical length at 23 weeks was 4.63 cm.  The ultrasound early in pregnancy showed a possible placenta previa, but this was confirmed that to be no longer a placenta previa on followup exams.  A 1 hour Glucola test was 76.  The patient's weight gain was somewhat excessive at 36 weeks.  She had gained 25 pounds since [redacted] weeks gestation.  At her last prenatal visit on November 09, 2014, her weight was 164 after being 134 at 20 weeks and 6 days.  The patient has been followed with nonstress test since her due date and they have been normal.  PAST MEDICAL HISTORY:  Reveals no significant medical illnesses.  She did have a laparotomy in March, 2015 for an 18 cm right ovarian cyst.  ALLERGIES:  She has no known drug allergies, no latex allergy, and no food allergy.  SOCIAL HISTORY:  The patient is a former smoker.  She has used marijuana.  She has 12 years  of education.  She was unemployed at the onset of pregnancy.  FAMILY HISTORY:  Paternal aunt with CVA at age 25, paternal grandmother and paternal grandmother with high blood pressure, mother had HIV and died at age 25.  PHYSICAL EXAMINATION:  VITAL SIGNS:  On admission, blood pressure 120/70, pulse of 70. HEART:  Normal size and sounds.  No murmurs. LUNGS:  Clear to auscultation. ABDOMEN:  Soft.  Fundal height 40 cm.  Fetal heart tones normal. PELVIC:  Cervix 2 cm, 30%, vertex at a -3.  ADMITTING IMPRESSION:  Intrauterine pregnancy at 40 weeks and 5 days, admitted for induction of labor.  Positive group B strep.  She will be treated with penicillin.     Malachi Prohomas F. Ambrose MantleHenley, M.D.     TFH/MEDQ  D:  11/09/2014  T:  11/09/2014  Job:  [id number]

## 2014-11-10 NOTE — Anesthesia Preprocedure Evaluation (Signed)
Anesthesia Evaluation  Patient identified by MRN, date of birth, ID band Patient awake    Reviewed: Allergy & Precautions, NPO status , Patient's Chart, lab work & pertinent test results  History of Anesthesia Complications Negative for: history of anesthetic complications  Airway Mallampati: II  TM Distance: >3 FB Neck ROM: Full    Dental no notable dental hx. (+) Dental Advisory Given   Pulmonary former smoker,  breath sounds clear to auscultation  Pulmonary exam normal       Cardiovascular negative cardio ROS  Rhythm:Regular Rate:Normal     Neuro/Psych negative neurological ROS  negative psych ROS   GI/Hepatic negative GI ROS, Neg liver ROS,   Endo/Other  negative endocrine ROS  Renal/GU negative Renal ROS  negative genitourinary   Musculoskeletal negative musculoskeletal ROS (+)   Abdominal   Peds negative pediatric ROS (+)  Hematology negative hematology ROS (+)   Anesthesia Other Findings   Reproductive/Obstetrics (+) Pregnancy                             Anesthesia Physical Anesthesia Plan  ASA: II  Anesthesia Plan: Epidural   Post-op Pain Management:    Induction:   Airway Management Planned:   Additional Equipment:   Intra-op Plan:   Post-operative Plan:   Informed Consent: I have reviewed the patients History and Physical, chart, labs and discussed the procedure including the risks, benefits and alternatives for the proposed anesthesia with the patient or authorized representative who has indicated his/her understanding and acceptance.   Dental advisory given  Plan Discussed with:   Anesthesia Plan Comments: (IOL for postdates)        Anesthesia Quick Evaluation

## 2014-11-10 NOTE — MAU Note (Signed)
Pt states that she began to contract at 1130 regularly. Pt states that baby is active and denies leaking of fluid and bleeding.

## 2014-11-10 NOTE — Progress Notes (Signed)
Patient ID: Mariah PoissonPatrize Capron, female   DOB: 05/18/1991, 24 y.o.   MRN: [id number] Pt is comfortable with her epidural The pitocin is at 30 mu/ minute and the contractions are q 2-3 minutes The cervix is 4 cm 90 % effaced and the vertex is at -1/-2 station

## 2014-11-11 ENCOUNTER — Encounter (HOSPITAL_COMMUNITY)
Admission: AD | Disposition: A | Discharge status: Home / Self Care | Payer: Self-pay | Source: Ambulatory Visit | Attending: Obstetrics and Gynecology

## 2014-11-11 ENCOUNTER — Encounter (HOSPITAL_COMMUNITY): Payer: Self-pay | Admitting: Anesthesiology

## 2014-11-11 DIAGNOSIS — Z98891 History of uterine scar from previous surgery: Secondary | ICD-10-CM

## 2014-11-11 DIAGNOSIS — Z3490 Encounter for supervision of normal pregnancy, unspecified, unspecified trimester: Secondary | ICD-10-CM

## 2014-11-11 LAB — RPR: RPR Ser Ql: NONREACTIVE

## 2014-11-11 SURGERY — Surgical Case
Anesthesia: Epidural

## 2014-11-11 MED ORDER — NALBUPHINE HCL 10 MG/ML IJ SOLN: 5.0000 mg | INTRAMUSCULAR | Status: DC | PRN

## 2014-11-11 MED ORDER — DIPHENHYDRAMINE HCL 25 MG PO CAPS: 25.0000 mg | ORAL_CAPSULE | Freq: Four times a day (QID) | ORAL | Status: DC | PRN

## 2014-11-11 MED ORDER — PHENYLEPHRINE HCL 10 MG/ML IJ SOLN
INTRAMUSCULAR | Status: DC | PRN
  Administered 2014-11-11: 40 ug via INTRAVENOUS
  Administered 2014-11-11: 80 ug via INTRAVENOUS
  Administered 2014-11-11: 40 ug via INTRAVENOUS

## 2014-11-11 MED ORDER — NALOXONE HCL 1 MG/ML IJ SOLN
1.0000 ug/kg/h | INTRAVENOUS | Status: DC | PRN
  Filled 2014-11-11: qty 2

## 2014-11-11 MED ORDER — CEFAZOLIN SODIUM-DEXTROSE 2-3 GM-% IV SOLR: 2.0000 g | Freq: Three times a day (TID) | INTRAVENOUS | Status: DC

## 2014-11-11 MED ORDER — NALBUPHINE HCL 10 MG/ML IJ SOLN: 5.0000 mg | Freq: Once | INTRAMUSCULAR | Status: AC | PRN

## 2014-11-11 MED ORDER — IBUPROFEN 600 MG PO TABS
600.0000 mg | ORAL_TABLET | Freq: Four times a day (QID) | ORAL | Status: DC
  Administered 2014-11-11 – 2014-11-13 (×7): 600 mg via ORAL
  Filled 2014-11-11 (×7): qty 1

## 2014-11-11 MED ORDER — SIMETHICONE 80 MG PO CHEW: 80.0000 mg | CHEWABLE_TABLET | ORAL | Status: DC | PRN

## 2014-11-11 MED ORDER — OXYTOCIN 40 UNITS IN LACTATED RINGERS INFUSION - SIMPLE MED: 62.5000 mL/h | INTRAVENOUS | Status: AC

## 2014-11-11 MED ORDER — ZOLPIDEM TARTRATE 5 MG PO TABS: 5.0000 mg | ORAL_TABLET | Freq: Every evening | ORAL | Status: DC | PRN

## 2014-11-11 MED ORDER — MEASLES, MUMPS & RUBELLA VAC ~~LOC~~ INJ
0.5000 mL | INJECTION | Freq: Once | SUBCUTANEOUS | Status: DC
  Filled 2014-11-11: qty 0.5

## 2014-11-11 MED ORDER — MEPERIDINE HCL 25 MG/ML IJ SOLN
INTRAMUSCULAR | Status: AC
  Filled 2014-11-11: qty 1

## 2014-11-11 MED ORDER — CEFAZOLIN SODIUM-DEXTROSE 2-3 GM-% IV SOLR
2.0000 g | Freq: Once | INTRAVENOUS | Status: AC
  Administered 2014-11-11: 2 g via INTRAVENOUS
  Filled 2014-11-11: qty 50

## 2014-11-11 MED ORDER — OXYTOCIN 40 UNITS IN LACTATED RINGERS INFUSION - SIMPLE MED: 1.0000 m[IU]/min | INTRAVENOUS | Status: AC

## 2014-11-11 MED ORDER — PENICILLIN G POTASSIUM 5000000 UNITS IJ SOLR
5.0000 10*6.[IU] | Freq: Once | INTRAVENOUS | Status: DC
  Filled 2014-11-11: qty 5

## 2014-11-11 MED ORDER — SCOPOLAMINE 1 MG/3DAYS TD PT72
MEDICATED_PATCH | TRANSDERMAL | Status: AC
  Administered 2014-11-11: 1.5 mg via TRANSDERMAL
  Filled 2014-11-11: qty 1

## 2014-11-11 MED ORDER — SODIUM BICARBONATE 8.4 % IV SOLN
INTRAVENOUS | Status: DC | PRN
  Administered 2014-11-11 (×4): 5 mL via EPIDURAL

## 2014-11-11 MED ORDER — SIMETHICONE 80 MG PO CHEW
80.0000 mg | CHEWABLE_TABLET | ORAL | Status: DC
  Administered 2014-11-12 (×2): 80 mg via ORAL
  Filled 2014-11-11 (×2): qty 1

## 2014-11-11 MED ORDER — ONDANSETRON HCL 4 MG PO TABS: 4.0000 mg | ORAL_TABLET | ORAL | Status: DC | PRN

## 2014-11-11 MED ORDER — DIPHENHYDRAMINE HCL 25 MG PO CAPS: 25.0000 mg | ORAL_CAPSULE | ORAL | Status: DC | PRN

## 2014-11-11 MED ORDER — NALOXONE HCL 0.4 MG/ML IJ SOLN: 0.4000 mg | INTRAMUSCULAR | Status: DC | PRN

## 2014-11-11 MED ORDER — MENTHOL 3 MG MT LOZG
1.0000 | LOZENGE | OROMUCOSAL | Status: DC | PRN
Start: 2014-11-11 — End: 2014-11-13

## 2014-11-11 MED ORDER — ONDANSETRON HCL 4 MG/2ML IJ SOLN
INTRAMUSCULAR | Status: AC
  Filled 2014-11-11: qty 2

## 2014-11-11 MED ORDER — SODIUM CHLORIDE 0.9 % IJ SOLN: 3.0000 mL | INTRAMUSCULAR | Status: DC | PRN

## 2014-11-11 MED ORDER — SENNOSIDES-DOCUSATE SODIUM 8.6-50 MG PO TABS
2.0000 | ORAL_TABLET | ORAL | Status: DC
  Administered 2014-11-12 (×2): 2 via ORAL
  Filled 2014-11-11 (×2): qty 2

## 2014-11-11 MED ORDER — MORPHINE SULFATE 0.5 MG/ML IJ SOLN
INTRAMUSCULAR | Status: AC
  Filled 2014-11-11: qty 10

## 2014-11-11 MED ORDER — OXYTOCIN 40 UNITS IN LACTATED RINGERS INFUSION - SIMPLE MED: 62.5000 mL/h | INTRAVENOUS | Status: DC

## 2014-11-11 MED ORDER — SCOPOLAMINE 1 MG/3DAYS TD PT72
1.0000 | MEDICATED_PATCH | Freq: Once | TRANSDERMAL | Status: DC
  Administered 2014-11-11: 1.5 mg via TRANSDERMAL

## 2014-11-11 MED ORDER — KETOROLAC TROMETHAMINE 30 MG/ML IJ SOLN
30.0000 mg | Freq: Once | INTRAMUSCULAR | Status: AC
  Administered 2014-11-11: 30 mg via INTRAVENOUS

## 2014-11-11 MED ORDER — LIDOCAINE HCL (PF) 1 % IJ SOLN
30.0000 mL | INTRAMUSCULAR | Status: DC | PRN
  Filled 2014-11-11: qty 30

## 2014-11-11 MED ORDER — DIPHENHYDRAMINE HCL 50 MG/ML IJ SOLN: 12.5000 mg | INTRAMUSCULAR | Status: DC | PRN

## 2014-11-11 MED ORDER — OXYTOCIN 10 UNIT/ML IJ SOLN
40.0000 [IU] | INTRAVENOUS | Status: DC | PRN
  Administered 2014-11-11: 40 [IU] via INTRAVENOUS

## 2014-11-11 MED ORDER — MEPERIDINE HCL 25 MG/ML IJ SOLN
INTRAMUSCULAR | Status: DC | PRN
  Administered 2014-11-11 (×2): 12.5 mg via INTRAVENOUS

## 2014-11-11 MED ORDER — DIBUCAINE 1 % RE OINT: 1.0000 "application " | TOPICAL_OINTMENT | RECTAL | Status: DC | PRN

## 2014-11-11 MED ORDER — OXYCODONE-ACETAMINOPHEN 5-325 MG PO TABS: 2.0000 | ORAL_TABLET | ORAL | Status: DC | PRN

## 2014-11-11 MED ORDER — LANOLIN HYDROUS EX OINT: 1.0000 "application " | TOPICAL_OINTMENT | CUTANEOUS | Status: DC | PRN

## 2014-11-11 MED ORDER — KETOROLAC TROMETHAMINE 30 MG/ML IJ SOLN: 30.0000 mg | Freq: Once | INTRAMUSCULAR | Status: DC

## 2014-11-11 MED ORDER — OXYCODONE-ACETAMINOPHEN 5-325 MG PO TABS: 1.0000 | ORAL_TABLET | ORAL | Status: DC | PRN

## 2014-11-11 MED ORDER — ONDANSETRON HCL 4 MG/2ML IJ SOLN: 4.0000 mg | INTRAMUSCULAR | Status: DC | PRN

## 2014-11-11 MED ORDER — PHENYLEPHRINE 40 MCG/ML (10ML) SYRINGE FOR IV PUSH (FOR BLOOD PRESSURE SUPPORT)
PREFILLED_SYRINGE | INTRAVENOUS | Status: AC
  Filled 2014-11-11: qty 10

## 2014-11-11 MED ORDER — OXYTOCIN 10 UNIT/ML IJ SOLN
INTRAMUSCULAR | Status: AC
  Filled 2014-11-11: qty 4

## 2014-11-11 MED ORDER — HYDROMORPHONE HCL 1 MG/ML IJ SOLN: 0.2500 mg | INTRAMUSCULAR | Status: DC | PRN

## 2014-11-11 MED ORDER — MORPHINE SULFATE (PF) 0.5 MG/ML IJ SOLN
INTRAMUSCULAR | Status: DC | PRN
  Administered 2014-11-11: 3000 ug via EPIDURAL

## 2014-11-11 MED ORDER — CEFAZOLIN SODIUM-DEXTROSE 2-3 GM-% IV SOLR
INTRAVENOUS | Status: AC
  Filled 2014-11-11: qty 50

## 2014-11-11 MED ORDER — TETANUS-DIPHTH-ACELL PERTUSSIS 5-2.5-18.5 LF-MCG/0.5 IM SUSP: 0.5000 mL | Freq: Once | INTRAMUSCULAR | Status: DC

## 2014-11-11 MED ORDER — LIDOCAINE-EPINEPHRINE (PF) 2 %-1:200000 IJ SOLN
INTRAMUSCULAR | Status: AC
  Filled 2014-11-11: qty 20

## 2014-11-11 MED ORDER — OXYTOCIN BOLUS FROM INFUSION: 500.0000 mL | INTRAVENOUS | Status: DC

## 2014-11-11 MED ORDER — LACTATED RINGERS IV SOLN: INTRAVENOUS | Status: DC

## 2014-11-11 MED ORDER — WITCH HAZEL-GLYCERIN EX PADS: 1.0000 "application " | MEDICATED_PAD | CUTANEOUS | Status: DC | PRN

## 2014-11-11 MED ORDER — ONDANSETRON HCL 4 MG/2ML IJ SOLN: 4.0000 mg | Freq: Three times a day (TID) | INTRAMUSCULAR | Status: DC | PRN

## 2014-11-11 MED ORDER — KETOROLAC TROMETHAMINE 30 MG/ML IJ SOLN
INTRAMUSCULAR | Status: AC
Start: 2014-11-11 — End: 2014-11-11
  Filled 2014-11-11: qty 1

## 2014-11-11 MED ORDER — DEXTROSE 5 % IV SOLN
2.5000 10*6.[IU] | INTRAVENOUS | Status: DC
  Filled 2014-11-11 (×2): qty 2.5

## 2014-11-11 MED ORDER — ONDANSETRON HCL 4 MG/2ML IJ SOLN
INTRAMUSCULAR | Status: DC | PRN
  Administered 2014-11-11: 4 mg via INTRAVENOUS

## 2014-11-11 MED ORDER — MEPERIDINE HCL 25 MG/ML IJ SOLN: 6.2500 mg | INTRAMUSCULAR | Status: DC | PRN

## 2014-11-11 MED ORDER — SIMETHICONE 80 MG PO CHEW
80.0000 mg | CHEWABLE_TABLET | Freq: Three times a day (TID) | ORAL | Status: DC
  Administered 2014-11-11 – 2014-11-13 (×5): 80 mg via ORAL
  Filled 2014-11-11 (×3): qty 1

## 2014-11-11 MED ORDER — LACTATED RINGERS IV SOLN: 500.0000 mL | INTRAVENOUS | Status: DC | PRN

## 2014-11-11 SURGICAL SUPPLY — 35 items
BENZOIN TINCTURE PRP APPL 2/3 (GAUZE/BANDAGES/DRESSINGS) ×3 IMPLANT
CLAMP CORD UMBIL (MISCELLANEOUS) IMPLANT
CLOSURE WOUND 1/2 X4 (GAUZE/BANDAGES/DRESSINGS) ×1
CLOTH BEACON ORANGE TIMEOUT ST (SAFETY) ×3 IMPLANT
CONTAINER PREFILL 10% NBF 15ML (MISCELLANEOUS) IMPLANT
DRAPE SHEET LG 3/4 BI-LAMINATE (DRAPES) IMPLANT
DRSG OPSITE POSTOP 4X10 (GAUZE/BANDAGES/DRESSINGS) ×3 IMPLANT
DRSG VASELINE 3X18 (GAUZE/BANDAGES/DRESSINGS) ×3 IMPLANT
DURAPREP 26ML APPLICATOR (WOUND CARE) ×3 IMPLANT
ELECT REM PT RETURN 9FT ADLT (ELECTROSURGICAL) ×3
ELECTRODE REM PT RTRN 9FT ADLT (ELECTROSURGICAL) ×1 IMPLANT
EXTRACTOR VACUUM KIWI (MISCELLANEOUS) IMPLANT
EXTRACTOR VACUUM M CUP 4 TUBE (SUCTIONS) ×2 IMPLANT
EXTRACTOR VACUUM M CUP 4' TUBE (SUCTIONS) ×1
GAUZE SPONGE 4X4 16PLY XRAY LF (GAUZE/BANDAGES/DRESSINGS) ×3 IMPLANT
GLOVE BIO SURGEON STRL SZ7.5 (GLOVE) ×3 IMPLANT
GOWN STRL REUS W/TWL LRG LVL3 (GOWN DISPOSABLE) ×6 IMPLANT
KIT ABG SYR 3ML LUER SLIP (SYRINGE) IMPLANT
NEEDLE HYPO 25X5/8 SAFETYGLIDE (NEEDLE) IMPLANT
NS IRRIG 1000ML POUR BTL (IV SOLUTION) ×3 IMPLANT
PACK C SECTION WH (CUSTOM PROCEDURE TRAY) ×3 IMPLANT
PAD ABD 7.5X8 STRL (GAUZE/BANDAGES/DRESSINGS) ×3 IMPLANT
PAD OB MATERNITY 4.3X12.25 (PERSONAL CARE ITEMS) ×3 IMPLANT
RTRCTR C-SECT PINK 25CM LRG (MISCELLANEOUS) ×3 IMPLANT
STAPLER VISISTAT 35W (STAPLE) IMPLANT
STRIP CLOSURE SKIN 1/2X4 (GAUZE/BANDAGES/DRESSINGS) ×2 IMPLANT
SUT PLAIN 0 NONE (SUTURE) IMPLANT
SUT VIC AB 0 CT1 36 (SUTURE) ×24 IMPLANT
SUT VIC AB 3-0 CTX 36 (SUTURE) ×3 IMPLANT
SUT VIC AB 3-0 SH 27 (SUTURE)
SUT VIC AB 3-0 SH 27X BRD (SUTURE) IMPLANT
SUT VIC AB 4-0 KS 27 (SUTURE) ×6 IMPLANT
SUT VICRYL 0 TIES 12 18 (SUTURE) IMPLANT
TOWEL OR 17X24 6PK STRL BLUE (TOWEL DISPOSABLE) ×3 IMPLANT
TRAY FOLEY CATH 14FR (SET/KITS/TRAYS/PACK) IMPLANT

## 2014-11-11 NOTE — Lactation Note (Signed)
This note was copied from the chart of Mariah Raymond. Lactation Consultation Note  Initial visit done.  Breastfeeding consultation services and support information given to patient. Mom states baby is latching easily and nursing well.  Instructed on feeding cues and encouraged to feed with any cue.  Encouraged to call with concerns/assist prn.  Patient Name: Mariah Hosie Poissonatrize Beneke Today's Date: 11/11/2014 Reason for consult: Initial assessment   Maternal Data Does the patient have breastfeeding experience prior to this delivery?: No  Feeding    LATCH Score/Interventions                      Lactation Tools Discussed/Used     Consult Status Consult Status: Follow-up Date: 11/12/14 Follow-up type: In-patient    Huston FoleyMOULDEN, Jozlyn Schatz S 11/11/2014, 3:09 PM

## 2014-11-11 NOTE — Transfer of Care (Signed)
Immediate Anesthesia Transfer of Care Note  Patient: Mariah PoissonPatrize Demetro  Procedure(s) Performed: Procedure(s): CESAREAN SECTION (N/A)  Patient Location: PACU  Anesthesia Type:Epidural  Level of Consciousness: awake, alert  and oriented  Airway & Oxygen Therapy: Patient Spontanous Breathing  Post-op Assessment: Report given to RN and Post -op Vital signs reviewed and stable  Post vital signs: Reviewed and stable  Last Vitals:  Filed Vitals:   11/11/14 0330  BP: 100/62  Pulse: 139  Temp:   Resp:     Complications: No apparent anesthesia complications

## 2014-11-11 NOTE — Progress Notes (Signed)
Patient ID: Mariah PoissonPatrize Raymond, female   DOB: 10/22/1990, 24 y.o.   MRN: [id number] This pt has been on pitocin almost 24 hours. The pitocin is at 40 mu/minute and the contractions are q 3 minutes. The cervix was 4 cm at 8 PM and has contracted well since that time but the cevix is only 5 cm and has remained that for several hours. I have informed the pt and her support  That I advise proceeding with c section for FTP She agrees.

## 2014-11-11 NOTE — Op Note (Signed)
NAMESKYELER, SCALESE NO.:  [id number]  MEDICAL RECORD NO.:  [id number]  LOCATION:  [facility]                          FACILITY:  [facility]  PHYSICIAN:  Malachi Pro. Ambrose Mantle, M.D. DATE OF BIRTH:  06/02/1991  DATE OF PROCEDURE: DATE OF DISCHARGE:                              OPERATIVE REPORT   PREOPERATIVE DIAGNOSIS:  Intrauterine pregnancy at 40 weeks and 6 days, failure to progress in labor.  POSTOPERATIVE DIAGNOSES:  Intrauterine pregnancy at 40 weeks and 6 days, failure to progress in labor.  OPERATIONS:  Low-transverse cervical cesarean section, removal of old keloid scar from the skin.  OPERATOR:  Malachi Pro. Ambrose Mantle, M.D.  ANESTHESIA:  Epidural anesthesia.  DESCRIPTION OF PROCEDURE:  The patient was brought to the operating room and the epidural anesthetic was boosted.  After the nurse anesthetist thought it was okay to prep, a Foley catheter was already indwelling, the abdomen was prepped with DuraPrep and allowed to dry for 3 minutes, during which, a time-out was done.  The abdomen was draped as a sterile field.  An anesthesia was confirmed over the incisional site, but just above the incisional site, the patient still felt sharp, so we waited several minutes, rechecked her, and she was anesthetic.  The keloid scar was removed going in.  The subcutaneous tissue was extremely scarred, almost looked like another layer of fascia.  We did get down to the fascia, incised it transversely, separated from the rectus muscles superiorly and inferiorly.  I created a midline, entered the peritoneal cavity, and there was no significant scarring.  The incision was enlarged.  An Alexis retractor was placed into the abdominal cavity, visualizing the lower uterine segment.  I made a short incision transversely in the lower uterine segment through the superficial layers of the myometrium.  I then entered the amniotic sac with my finger, clear fluid.  I enlarged the incision by  pulling superiorly and inferiorly.  Delivered the baby up to the incisional opening, but it did not seem to want to come out, so I placed a Mushroom Mityvac vacuum on the head and with gentle steady traction and fundal pressure, gradually the baby's head became visible and delivered.  Then, the shoulders were not easy to get out either, they took some manipulation.  The baby was finally delivered.  Cord was clamped and the infant actually breathed and gasped on the table, moved his arms and legs.  Cord was clamped and cut and the infant was given to the Neonatal team.  According to Dr. Algernon Huxley, the baby took its first breath at 3 minutes of age, however, the baby had already breathed while it was on the mother's abdomen, so apparently from the time the baby was delivered until the time it took its first breath under the observation of the Neonatal Team, it had actually lost its respirations.  By 5 minutes, Dr. Algernon Huxley gave the baby an Apgar of 8.  The placenta was removed intact.  The uterine angles were clamped with ring forceps.  The inside of the uterus was inspected and found to be free of any debris.  The left tube and ovary were absent.  The right tube and  ovary appeared normal.  The uterus appeared normal.  The uterine incision was then closed with a running lock suture of 0 Vicryl on the first layer, nonlocking suture of the same material on the second layer.  Hemostasis appeared adequate.  The gutters were blotted free of blood.  Liberal irrigation confirmed hemostasis.  I had exteriorized the uterus to get better visualization, this was not easy, but it did not deliver, and then returning the uterus to the abdominal cavity required quite a bit of manipulation including the use of a Deaver retractor and removing the Teacher, early years/preAlexis retractor.  After the uterus was reinserted into the abdominal cavity and hemostasis was adequate, the abdominal wall was closed in layers using an interrupted  sutures of 0 Vicryl to close the rectus muscle and peritoneum in 1 layer, 2 running sutures of 0 Vicryl on the fascia, a running 3-0 Vicryl on the extremely dense fibrotic subcutaneous tissue, and the skin was closed with the 4-0 Mellody DanceKeith needle.  Hoping for a better scar than she had the first time, although the patient was informed that the scar might be worse.  Sponge and needle counts were correct.  Blood loss was estimated by the nurse anesthetist as 700 mL.  The patient returned to recovery in satisfactory condition.     Malachi Prohomas F. Ambrose MantleHenley, M.D.     TFH/MEDQ  D:  11/11/2014  T:  11/11/2014  Job:  [id number]

## 2014-11-11 NOTE — Progress Notes (Signed)
Dr. Ambrose MantleHenley stated he will be staying in-house and can be reached @ [phone]

## 2014-11-11 NOTE — Anesthesia Postprocedure Evaluation (Signed)
  Anesthesia Post-op Note  Patient: Mariah PoissonPatrize Raymond  Procedure(s) Performed: Procedure(s): CESAREAN SECTION (N/A)  Patient Location: Mother/Baby  Anesthesia Type:Epidural  Level of Consciousness: awake, alert , oriented and patient cooperative  Airway and Oxygen Therapy: Patient Spontanous Breathing  Post-op Pain: none  Post-op Assessment: Post-op Vital signs reviewed, Patient's Cardiovascular Status Stable, Respiratory Function Stable, Patent Airway, No headache, No backache, No residual numbness and No residual motor weakness  Post-op Vital Signs: Reviewed and stable  Last Vitals:  Filed Vitals:   11/11/14 1015  BP: 119/60  Pulse: 95  Temp: 36.9 C  Resp: 20    Complications: No apparent anesthesia complications

## 2014-11-11 NOTE — Anesthesia Postprocedure Evaluation (Signed)
  Anesthesia Post-op Note  Patient: Mariah PoissonPatrize Raymond  Procedure(s) Performed: Procedure(s): CESAREAN SECTION (N/A)  Patient Location: PACU  Anesthesia Type:Epidural  Level of Consciousness: awake, alert  and oriented  Airway and Oxygen Therapy: Patient Spontanous Breathing  Post-op Pain: none  Post-op Assessment: Post-op Vital signs reviewed, Patient's Cardiovascular Status Stable, Respiratory Function Stable, Patent Airway, No signs of Nausea or vomiting, Pain level controlled, No headache, No backache and No residual numbness  Post-op Vital Signs: Reviewed and stable  Last Vitals:  Filed Vitals:   11/11/14 0715  BP: 111/62  Pulse: 84  Temp:   Resp: 20    Complications: No apparent anesthesia complications

## 2014-11-12 ENCOUNTER — Encounter (HOSPITAL_COMMUNITY): Payer: Self-pay | Admitting: Obstetrics and Gynecology

## 2014-11-12 LAB — CBC
HCT: 26.9 % — ABNORMAL LOW (ref 36.0–46.0)
Hemoglobin: 8.7 g/dL — ABNORMAL LOW (ref 12.0–15.0)
MCH: 26.6 pg (ref 26.0–34.0)
MCHC: 32.3 g/dL (ref 30.0–36.0)
MCV: 82.3 fL (ref 78.0–100.0)
PLATELETS: 225 10*3/uL (ref 150–400)
RBC: 3.27 MIL/uL — AB (ref 3.87–5.11)
RDW: 14.1 % (ref 11.5–15.5)
WBC: 14.3 10*3/uL — ABNORMAL HIGH (ref 4.0–10.5)

## 2014-11-12 LAB — BIRTH TISSUE RECOVERY COLLECTION (PLACENTA DONATION)

## 2014-11-12 NOTE — Lactation Note (Signed)
This note was copied from the chart of Mariah Raymond. Lactation Consultation Note  Patient Name: Mariah Hosie Poissonatrize Alfredo NUUVO'ZToday's Date: 11/12/2014 Reason for consult: Follow-up assessment  Folow-up at 6135 hours old.  Mom is P!.  Infant has breastfed x7 (10-35 min); voids-7; stools-2 in past 24 hours.  LS-7 by RN.  Mom had just finished feeding infant when LC entered so LC did not get to see latch.  Mom reports BFW, no complaints, denies pain.  Educated to continue feeding with cues; educated about cluster feeding and size of infant's stomach and growth spurts.  Mom has HP in room and has WIC.  [facility].  Encouraged to call for assistance as needed.    Consult Status Consult Status: Follow-up Date: 11/13/14 Follow-up type: In-patient    Mariah Raymond, Mariah Raymond 11/12/2014, 5:01 PM

## 2014-11-12 NOTE — Progress Notes (Signed)
Patient ID: Mariah PoissonPatrize Raymond, female   DOB: 01/18/1991, 24 y.o.   MRN: [id number] #1 afebrile BP normal HGB 10.6 to 8.7. She is ambulating well, tolerating a diet, passing flayus and voiding well. Her abdomen is soft and not tender.

## 2014-11-12 NOTE — Progress Notes (Signed)
Clinical Social Work Department PSYCHOSOCIAL ASSESSMENT - MATERNAL/CHILD 11/12/2014  Patient:  Mariah Raymond, Mariah Raymond  Account Number:  [id number]  [place] Date:  11/10/2014  Ardine Eng Name:   Kathyrn Sheriff   Clinical Social Worker:  Lucita Ferrara, CLINICAL SOCIAL WORKER   Date/Time:  11/12/2014 10:00 AM  Date Referred:  11/12/2014   Referral source  Central Nurs[facility]     Referred reason  Substance Abuse   Other referral source:    I:  FAMILY / HOME ENVIRONMENT Child's legal guardian:  PARENT  Guardian - Name Guardian - Age [place] Isac Caddy [place], [place] [address]  Tera Mater  same as above   Other household support members/support persons Name Relationship DOB   AUNT    Other support:   MOB endorsed strong family support.    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 The FOB endorsed supportive employer, but stated that he will leaving for basic training, since he recently joined the [employer].   Financial resources:  Medicaid If Medicaid - County:  [place] / Grade:  N/A Music therapist / Child Services Coordination / Early Interventions:   None reported  Cultural issues impacting care:   None reported    III  STRENGTHS Strengths  Adequate Resources  Home prepared for Child (including basic supplies)  Supportive family/friends   Strength comment:    IV  RISK FACTORS AND CURRENT PROBLEMS Current Problem:  YES   Risk Factor & Current Problem Patient Issue Family Issue Risk Factor / Current Problem Comment  Substance Abuse Y N MOB endorsed THC use until she learned that she was pregnant. The infant's UDS is negative and the MDS is pending.    V  SOCIAL WORK ASSESSMENT CSW received referral due to history of THC use during the  pregnancy.  CSW met with the MOB in order to provide emotional support as she transitions to motherhood and in order to evaluate substance use. MOB provided consent for the FOB to be present during the assessment.  MOB and FOB were both easily engaged and receptive to the visit.  They were observed to be interacting and bonding with the infant. The MOB was noted to have a full range in affect and she presented in a pleasant mood.    MOB smiled as she reflected upon her thoughts and feelings as she transitions to motherhood. She stated that it continues to feel surreal, but endorsed "loving it". She did not presents as anxious, and she denied any feelings of stress or being overwhelmed. She acknowledged that these feelings will likely be present as she transitions home and continues to adjust to having an infant.   MOB endorsed having strong family support, and discussed that the home is "more than ready" for the infant's arrival.  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  She reported low stress in life, and denied the presence of any events that may negatively impact her transition into the postpartum period.  MOB also denied mental health history and denied mental health concerns in the postpartum period. MOB presented as receptive to education on the baby blues and postpartum depression.  The FOB also inquired about ways to support the MOB if she experiences symptoms, and they acknowledged  ability to talk to the MOB's MD.   CSW inquired about the MOB's feelings secondary to her C-section.  The MOB stated that it was "unplanned", but she also anticipated that it may occur based on conversations with her MD prior to delivery. She acknowledged that she was originally stressed about the thought of having a C-section, but stated that she feels better about it now. She reported a long labor, and the frustration she felt since labor resolved in the C-section.  She did not indicate or verbalize any lingering unresolved feelings secondary to her C-section, and stated that she has "extra support" once she transitions home and recovers.   MOB reported THC use (socially) until she learned that she was pregnant.  She acknowledged that she did not know that she was pregnant "immediately", but reported that she ceased all substance use once she learned of the pregnancy.  MOB verbalized understanding of hospital drug screen policy and denied questions or concerns about the policy.  She was informed that the infant's urine is negative, and she expressed a belief that the infant's MDS will also be negative.   The MOB denied additional questions, concerns, or needs at this time. She acknowledged ongoing CSW availability as needed during hospital admission.   VI SOCIAL WORK PLAN Social Work Therapist, art  No Further Intervention Required / No Barriers to Discharge   Type of pt/family education:   Postpartum depression  Hospital drug screen policy   If child protective services report - county:   If child protective services report - date:   Information/referral to community resources comment:   No referrals needed at this time.  MOB reported intention to follow up with her OB if she notes symptoms of postpartum depression.   Other social work plan:   CSW to follow up as needed or upon MOB request.  CSW to monitor MDS and will make a CPS report if needed.

## 2014-11-13 MED ORDER — IBUPROFEN 600 MG PO TABS: 600.0000 mg | ORAL_TABLET | Freq: Four times a day (QID) | ORAL | Status: AC

## 2014-11-13 MED ORDER — HYDROCODONE-ACETAMINOPHEN 5-325 MG PO TABS: 1.0000 | ORAL_TABLET | ORAL | Status: DC | PRN

## 2014-11-13 MED ORDER — HYDROCODONE-ACETAMINOPHEN 5-325 MG PO TABS: 1.0000 | ORAL_TABLET | ORAL | Status: AC | PRN

## 2014-11-13 NOTE — Progress Notes (Signed)
POD #2 Doing well, wants to go home Afeb, VSS Abd- soft, incision intact D/c home 

## 2014-11-13 NOTE — Discharge Summary (Signed)
Obstetric Discharge Summary Reason for Admission: onset of labor Prenatal Procedures: none Intrapartum Procedures: cesarean: low cervical, transverse Postpartum Procedures: none Complications-Operative and Postpartum: none HEMOGLOBIN  Date Value Ref Range Status  11/12/2014 8.7* 12.0 - 15.0 g/dL Final   HCT  Date Value Ref Range Status  11/12/2014 26.9* 36.0 - 46.0 % Final    Physical Exam:  General: alert Lochia: appropriate Uterine Fundus: firm Incision: healing well   Discharge Diagnoses: Term Pregnancy-delivered and arrest of dilation  Discharge Information: Date: 11/13/2014 Activity: pelvic rest and no strenuous activity Diet: routine Medications: Ibuprofen and Vicodin Condition: stable Instructions: refer to practice specific booklet Discharge to: home Follow-up Information    Follow up with Bing PlumeHENLEY,THOMAS F, MD. Schedule an appointment as soon as possible for a visit in 2 weeks.   Specialty:  Obstetrics and Gynecology   Why:  incision check   Contact information:   [address], [address] [place] [phone]       Newborn Data: Live born female  Birth Weight: 7 lb 13 oz (3545 g) APGAR: 1, 8  Home with mother.  Mariah Raymond D 11/13/2014, 9:58 AM

## 2014-11-13 NOTE — Lactation Note (Signed)
This note was copied from the chart of Mariah Raymond. Lactation Consultation Note  Patient Name: Mariah Hosie Poissonatrize Filippini RUEAV'WToday's Date: 11/13/2014 Reason for consult: Follow-up assessment    Mom and baby now 54 hours post partum. Mom reports breast feeding going well. Nipples sore only at beginning of latch, otherwise fine. On exam, mom's breasts are very full, slightly  engorged. I gave mom ice, and instructed eher in it's use. Mom knows to call lactation for questions/concerns.    Maternal Data    Feeding Feeding Type: Breast Fed Length of feed: 25 min  LATCH Score/Interventions          Comfort (Breast/Nipple): Filling, red/small blisters or bruises, mild/mod discomfort Problem noted: Engorgment Intervention(s): Ice  Problem noted: Filling Interventions (Filling): Hand pump        Lactation Tools Discussed/Used     Consult Status Consult Status: Complete Follow-up type: Call as needed    Mariah Raymond, Mariah Raymond 11/13/2014, 10:30 AM

## 2014-11-13 NOTE — Discharge Instructions (Signed)
As per discharge pamphlet °

## 2014-11-20 IMAGING — US US OB TRANSVAGINAL
1 series · 14 of 28 positions shown · non-contrast
Comparison: None.

CLINICAL DATA: Low abdominal pain. Positive urine pregnancy test.
Estimated gestational age by LMP is 6 weeks 6 days. Quantitative
beta HCG is [DATE].

EXAM:
OBSTETRIC <14 WK ULTRASOUND
TECHNIQUE: Transabdominal ultrasound was performed for evaluation of the
gestation as well as the maternal uterus and adnexal regions.

[Series 1: us ob comp less 14 wks · 14 of 71 slices shown]
[im 3/71]
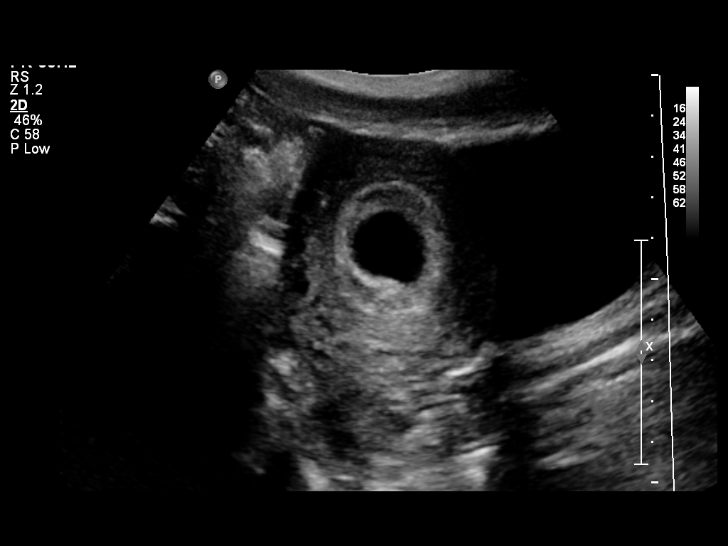
[im 8/71]
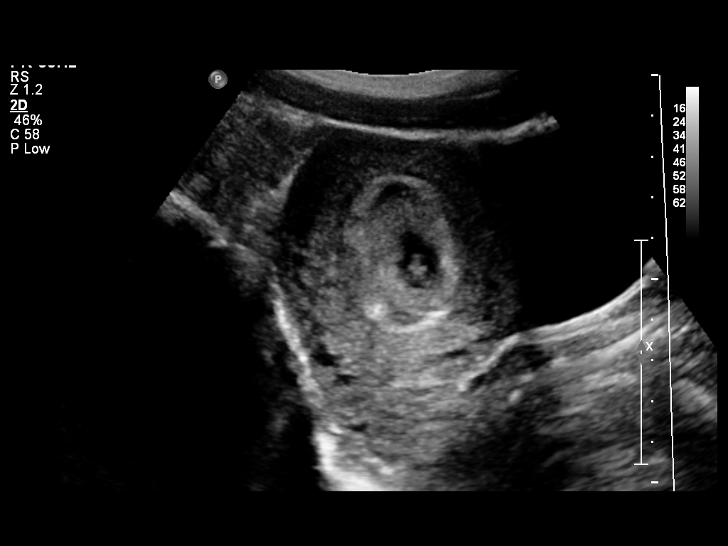
[im 13/71]
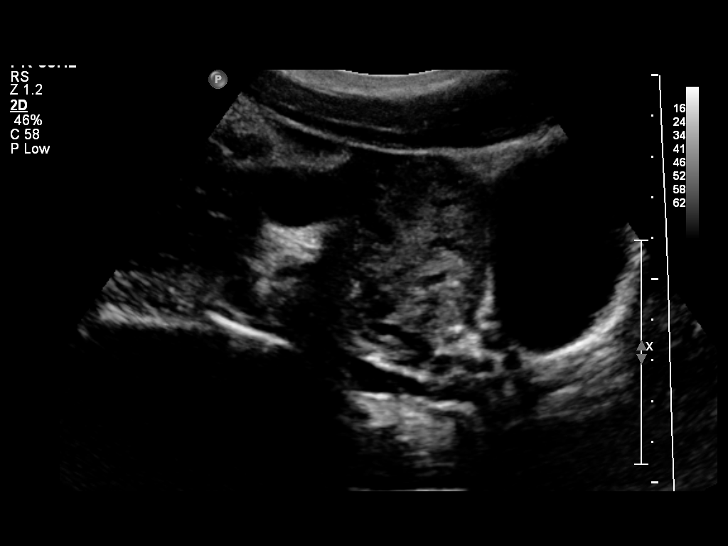
[im 19/71]
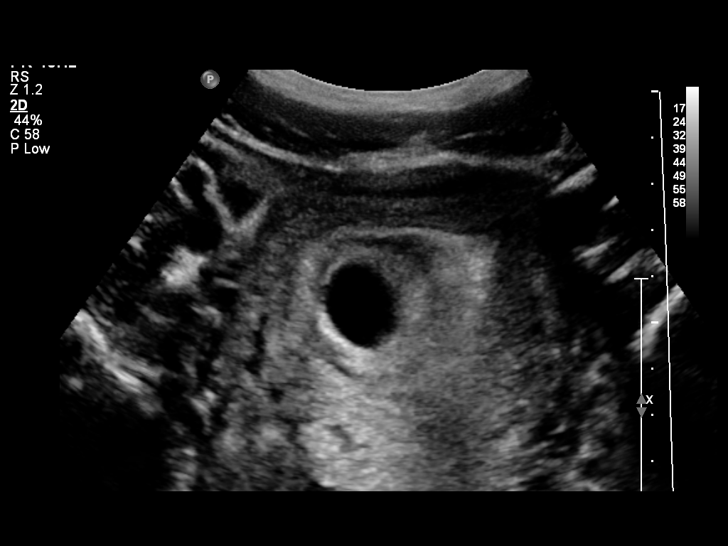
[im 24/71]
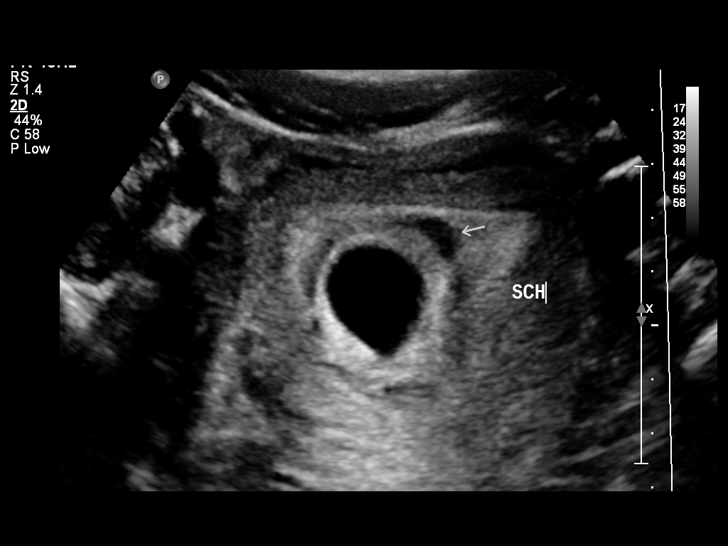
[im 29/71]
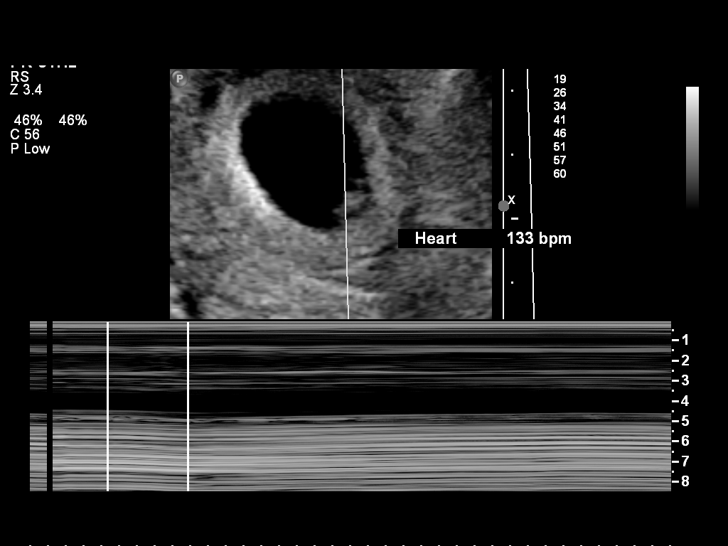
[im 34/71]
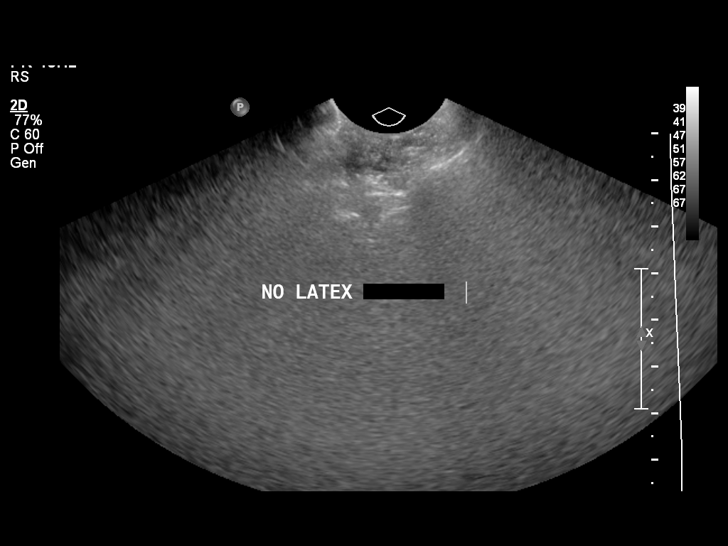
[im 39/71]
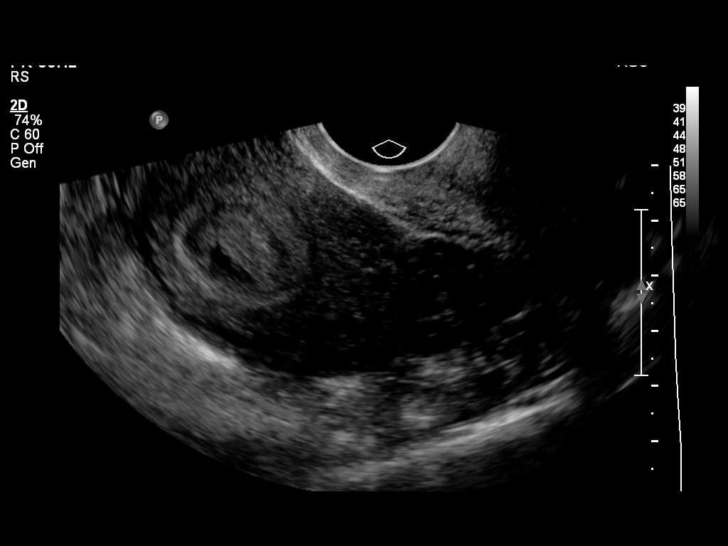
[im 45/71]
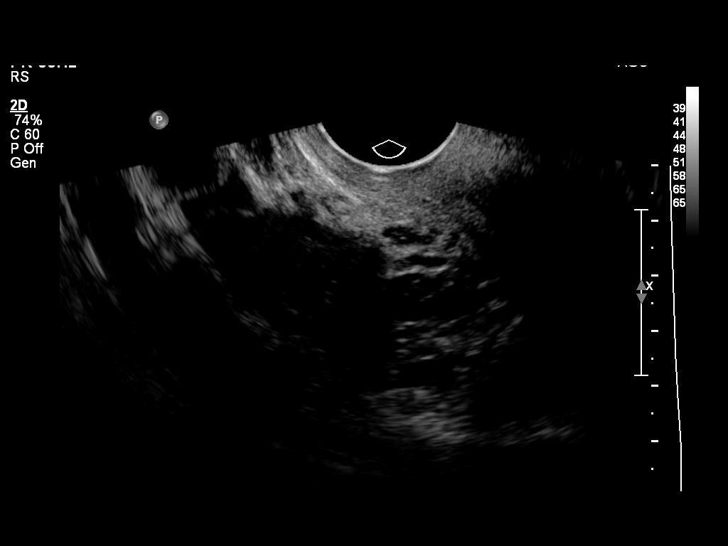
[im 50/71]
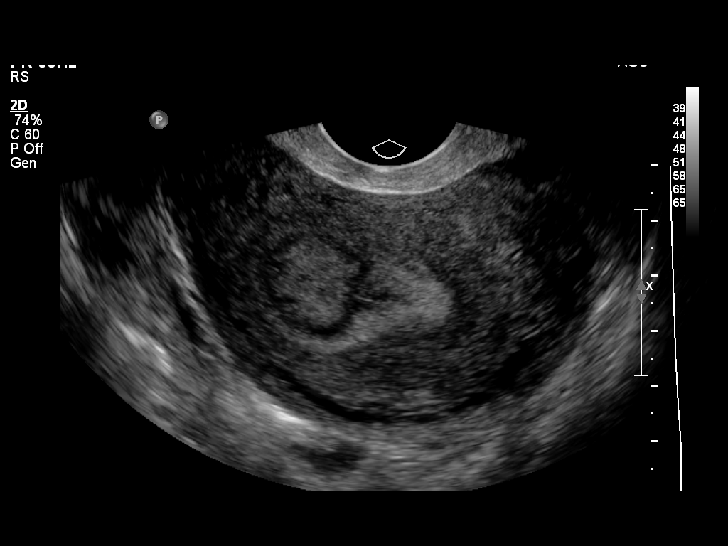
[im 55/71]
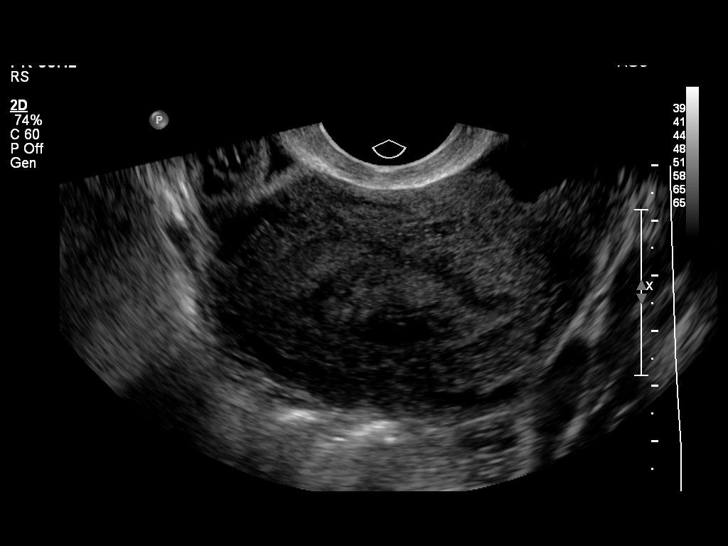
[im 60/71]
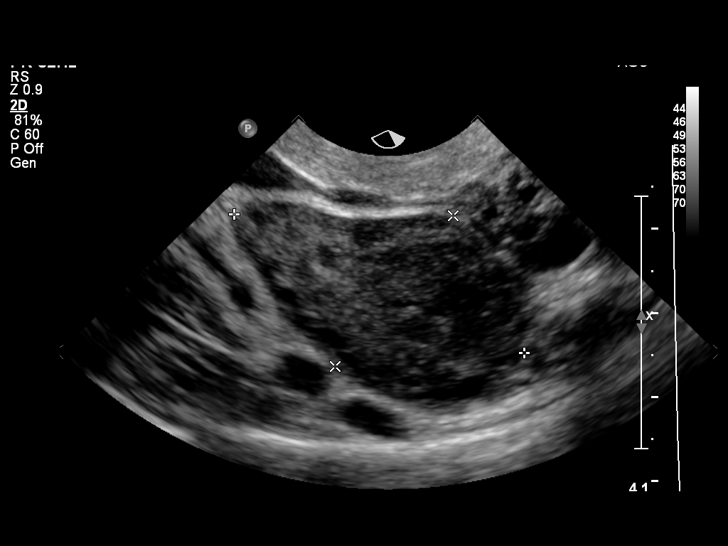
[im 65/71]
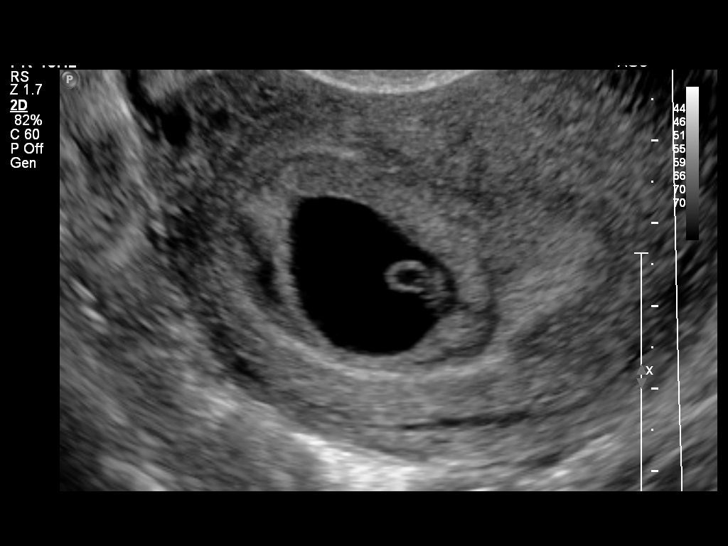
[im 71/71]
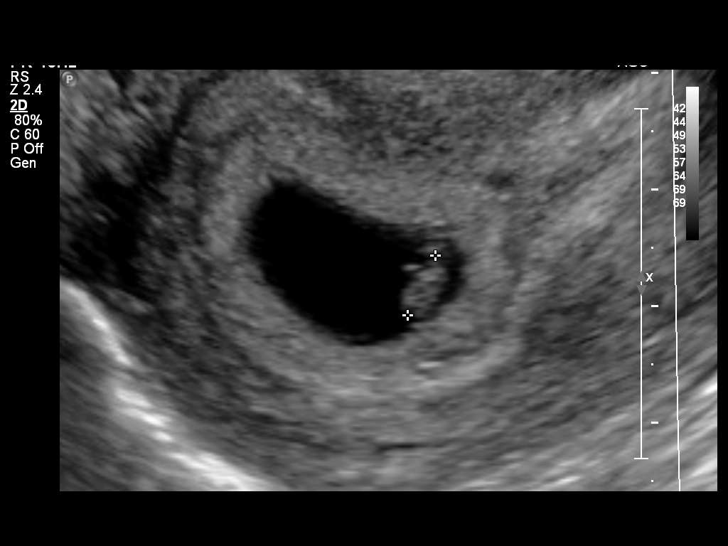

[14 of 28 positions shown; findings below may reference images not displayed]

FINDINGS: Intrauterine gestational sac: A single intrauterine gestational sac
is visualized.

Yolk sac:  Yolk sac is visualized.

Embryo:  Fetal pole is visualized.

Cardiac Activity: Fetal cardiac activity is observed.

Heart Rate: 134 bpm

CRL:   6.5  mm   6 w 4 d                  US EDC: 11/05/2014

Maternal uterus/adnexae: There is a small subchorionic hemorrhage
present. No myometrial mass lesions. Right ovary is identified and
appears normal. Left ovary is not visualized. Mild free fluid in the
pelvis.
IMPRESSION: Single intrauterine pregnancy identified. Estimated gestational age
by crown-rump length is 6 weeks 4 days. This is consistent with
reported maternal dates. Small subchorionic hemorrhage is
identified.

## 2014-11-24 ENCOUNTER — Encounter (HOSPITAL_COMMUNITY): Payer: Self-pay | Admitting: Obstetrics and Gynecology

## 2014-11-24 NOTE — Addendum Note (Signed)
Addendum  created 11/24/14 1852 by Cristela BlueKyle Leeon Makar, MD   Modules edited: Anesthesia Events, Narrator   Narrator:  Narrator: Event Log Edited

## 2014-11-28 ENCOUNTER — Encounter (HOSPITAL_COMMUNITY): Payer: Self-pay | Admitting: Obstetrics and Gynecology

## 2014-11-28 NOTE — Addendum Note (Signed)
Addendum  created 11/28/14 0905 by Felipe DroneMary Jennette Windi Toro, MD   Modules edited: Anesthesia Events
